# Patient Record
Sex: Female | Born: 1945
Health system: Southern US, Community
[De-identification: ages and names within clinical notes are randomized; demographics above are authoritative.]

## PROBLEM LIST (undated history)

## (undated) DIAGNOSIS — D649 Anemia, unspecified: Secondary | ICD-10-CM

## (undated) DIAGNOSIS — K7460 Unspecified cirrhosis of liver: Secondary | ICD-10-CM

## (undated) DIAGNOSIS — E785 Hyperlipidemia, unspecified: Secondary | ICD-10-CM

## (undated) DIAGNOSIS — R7303 Prediabetes: Secondary | ICD-10-CM

## (undated) DIAGNOSIS — I10 Essential (primary) hypertension: Secondary | ICD-10-CM

## (undated) DIAGNOSIS — L409 Psoriasis, unspecified: Secondary | ICD-10-CM

## (undated) DIAGNOSIS — C679 Malignant neoplasm of bladder, unspecified: Secondary | ICD-10-CM

## (undated) DIAGNOSIS — K219 Gastro-esophageal reflux disease without esophagitis: Secondary | ICD-10-CM

## (undated) HISTORY — PX: COLONOSCOPY: SHX174

## (undated) HISTORY — PX: ABDOMINAL HYSTERECTOMY: SHX81

## (undated) HISTORY — DX: Malignant neoplasm of bladder, unspecified: C67.9

---

## 2004-12-31 ENCOUNTER — Ambulatory Visit: Payer: Self-pay | Admitting: Unknown Physician Specialty

## 2005-03-15 ENCOUNTER — Ambulatory Visit: Payer: Self-pay | Admitting: Unknown Physician Specialty

## 2006-01-28 ENCOUNTER — Ambulatory Visit: Payer: Self-pay | Admitting: Unknown Physician Specialty

## 2007-02-10 ENCOUNTER — Ambulatory Visit: Payer: Self-pay | Admitting: Unknown Physician Specialty

## 2007-04-15 ENCOUNTER — Emergency Department: Payer: Self-pay | Admitting: Emergency Medicine

## 2007-12-12 ENCOUNTER — Ambulatory Visit: Payer: Self-pay | Admitting: Internal Medicine

## 2008-03-29 ENCOUNTER — Ambulatory Visit: Payer: Self-pay | Admitting: Unknown Physician Specialty

## 2008-06-30 ENCOUNTER — Ambulatory Visit: Payer: Self-pay | Admitting: Internal Medicine

## 2008-10-27 ENCOUNTER — Ambulatory Visit: Payer: Self-pay | Admitting: Unknown Physician Specialty

## 2009-03-30 ENCOUNTER — Ambulatory Visit: Payer: Self-pay | Admitting: Unknown Physician Specialty

## 2009-12-20 ENCOUNTER — Ambulatory Visit: Payer: Self-pay | Admitting: Unknown Physician Specialty

## 2010-04-03 ENCOUNTER — Ambulatory Visit: Payer: Self-pay | Admitting: Unknown Physician Specialty

## 2010-07-28 ENCOUNTER — Ambulatory Visit: Payer: Self-pay | Admitting: Internal Medicine

## 2011-09-04 ENCOUNTER — Ambulatory Visit: Payer: Self-pay | Admitting: Unknown Physician Specialty

## 2012-10-05 ENCOUNTER — Ambulatory Visit: Payer: Self-pay | Admitting: Unknown Physician Specialty

## 2012-12-18 ENCOUNTER — Ambulatory Visit: Payer: Self-pay | Admitting: Unknown Physician Specialty

## 2012-12-21 LAB — PATHOLOGY REPORT

## 2014-02-10 ENCOUNTER — Ambulatory Visit: Payer: Self-pay | Admitting: Physician Assistant

## 2014-05-13 ENCOUNTER — Ambulatory Visit: Payer: Self-pay | Admitting: Unknown Physician Specialty

## 2014-05-17 LAB — PATHOLOGY REPORT

## 2014-12-28 DIAGNOSIS — D510 Vitamin B12 deficiency anemia due to intrinsic factor deficiency: Secondary | ICD-10-CM | POA: Diagnosis not present

## 2014-12-28 DIAGNOSIS — E538 Deficiency of other specified B group vitamins: Secondary | ICD-10-CM | POA: Diagnosis not present

## 2015-02-09 DIAGNOSIS — D510 Vitamin B12 deficiency anemia due to intrinsic factor deficiency: Secondary | ICD-10-CM | POA: Diagnosis not present

## 2015-02-21 DIAGNOSIS — E780 Pure hypercholesterolemia: Secondary | ICD-10-CM | POA: Diagnosis not present

## 2015-02-21 DIAGNOSIS — D508 Other iron deficiency anemias: Secondary | ICD-10-CM | POA: Diagnosis not present

## 2015-02-21 DIAGNOSIS — Z9189 Other specified personal risk factors, not elsewhere classified: Secondary | ICD-10-CM | POA: Diagnosis not present

## 2015-02-21 DIAGNOSIS — D518 Other vitamin B12 deficiency anemias: Secondary | ICD-10-CM | POA: Diagnosis not present

## 2015-02-21 DIAGNOSIS — L409 Psoriasis, unspecified: Secondary | ICD-10-CM | POA: Diagnosis not present

## 2015-02-21 DIAGNOSIS — Z23 Encounter for immunization: Secondary | ICD-10-CM | POA: Diagnosis not present

## 2015-02-21 DIAGNOSIS — I10 Essential (primary) hypertension: Secondary | ICD-10-CM | POA: Diagnosis not present

## 2015-03-01 ENCOUNTER — Ambulatory Visit: Payer: Self-pay | Admitting: Physician Assistant

## 2015-03-01 DIAGNOSIS — R928 Other abnormal and inconclusive findings on diagnostic imaging of breast: Secondary | ICD-10-CM | POA: Diagnosis not present

## 2015-03-01 DIAGNOSIS — Z1231 Encounter for screening mammogram for malignant neoplasm of breast: Secondary | ICD-10-CM | POA: Diagnosis not present

## 2015-03-07 ENCOUNTER — Ambulatory Visit: Payer: Self-pay | Admitting: Physician Assistant

## 2015-03-07 DIAGNOSIS — N63 Unspecified lump in breast: Secondary | ICD-10-CM | POA: Diagnosis not present

## 2015-03-28 DIAGNOSIS — D510 Vitamin B12 deficiency anemia due to intrinsic factor deficiency: Secondary | ICD-10-CM | POA: Diagnosis not present

## 2015-05-03 DIAGNOSIS — D510 Vitamin B12 deficiency anemia due to intrinsic factor deficiency: Secondary | ICD-10-CM | POA: Diagnosis not present

## 2015-05-29 DIAGNOSIS — L820 Inflamed seborrheic keratosis: Secondary | ICD-10-CM | POA: Diagnosis not present

## 2015-05-29 DIAGNOSIS — L400 Psoriasis vulgaris: Secondary | ICD-10-CM | POA: Diagnosis not present

## 2015-06-06 DIAGNOSIS — E538 Deficiency of other specified B group vitamins: Secondary | ICD-10-CM | POA: Diagnosis not present

## 2015-07-27 DIAGNOSIS — E538 Deficiency of other specified B group vitamins: Secondary | ICD-10-CM | POA: Diagnosis not present

## 2015-08-24 ENCOUNTER — Other Ambulatory Visit: Payer: Self-pay | Admitting: Physician Assistant

## 2015-08-24 DIAGNOSIS — R7309 Other abnormal glucose: Secondary | ICD-10-CM | POA: Diagnosis not present

## 2015-08-24 DIAGNOSIS — L409 Psoriasis, unspecified: Secondary | ICD-10-CM | POA: Diagnosis not present

## 2015-08-24 DIAGNOSIS — E785 Hyperlipidemia, unspecified: Secondary | ICD-10-CM | POA: Diagnosis not present

## 2015-08-24 DIAGNOSIS — E780 Pure hypercholesterolemia: Secondary | ICD-10-CM | POA: Diagnosis not present

## 2015-08-24 DIAGNOSIS — N6002 Solitary cyst of left breast: Secondary | ICD-10-CM | POA: Diagnosis not present

## 2015-08-24 DIAGNOSIS — I10 Essential (primary) hypertension: Secondary | ICD-10-CM | POA: Diagnosis not present

## 2015-08-24 DIAGNOSIS — D518 Other vitamin B12 deficiency anemias: Secondary | ICD-10-CM | POA: Diagnosis not present

## 2015-08-29 DIAGNOSIS — E785 Hyperlipidemia, unspecified: Secondary | ICD-10-CM | POA: Diagnosis not present

## 2015-08-29 DIAGNOSIS — R7309 Other abnormal glucose: Secondary | ICD-10-CM | POA: Diagnosis not present

## 2015-08-29 DIAGNOSIS — E538 Deficiency of other specified B group vitamins: Secondary | ICD-10-CM | POA: Diagnosis not present

## 2015-08-29 DIAGNOSIS — E780 Pure hypercholesterolemia: Secondary | ICD-10-CM | POA: Diagnosis not present

## 2015-08-29 DIAGNOSIS — I10 Essential (primary) hypertension: Secondary | ICD-10-CM | POA: Diagnosis not present

## 2015-09-01 ENCOUNTER — Other Ambulatory Visit: Payer: Self-pay

## 2015-09-01 ENCOUNTER — Ambulatory Visit: Payer: Self-pay

## 2015-09-05 ENCOUNTER — Ambulatory Visit
Admission: RE | Admit: 2015-09-05 | Discharge: 2015-09-05 | Disposition: A | Discharge status: Home / Self Care | Payer: Commercial Managed Care - HMO | Source: Ambulatory Visit | Attending: Physician Assistant | Admitting: Physician Assistant

## 2015-09-05 ENCOUNTER — Other Ambulatory Visit: Payer: Self-pay | Admitting: Physician Assistant

## 2015-09-05 DIAGNOSIS — N63 Unspecified lump in breast: Secondary | ICD-10-CM | POA: Diagnosis not present

## 2015-09-05 DIAGNOSIS — N6002 Solitary cyst of left breast: Secondary | ICD-10-CM

## 2015-09-29 DIAGNOSIS — E538 Deficiency of other specified B group vitamins: Secondary | ICD-10-CM | POA: Diagnosis not present

## 2015-09-29 DIAGNOSIS — Z23 Encounter for immunization: Secondary | ICD-10-CM | POA: Diagnosis not present

## 2015-10-08 ENCOUNTER — Emergency Department: Payer: Commercial Managed Care - HMO

## 2015-10-08 ENCOUNTER — Encounter: Payer: Self-pay | Admitting: Emergency Medicine

## 2015-10-08 ENCOUNTER — Emergency Department
Admission: EM | Admit: 2015-10-08 | Discharge: 2015-10-08 | Disposition: A | Discharge status: Home / Self Care | Payer: Commercial Managed Care - HMO | Attending: Emergency Medicine | Admitting: Emergency Medicine

## 2015-10-08 DIAGNOSIS — R109 Unspecified abdominal pain: Secondary | ICD-10-CM | POA: Diagnosis not present

## 2015-10-08 DIAGNOSIS — K5901 Slow transit constipation: Secondary | ICD-10-CM | POA: Diagnosis not present

## 2015-10-08 DIAGNOSIS — R1030 Lower abdominal pain, unspecified: Secondary | ICD-10-CM | POA: Diagnosis not present

## 2015-10-08 DIAGNOSIS — I10 Essential (primary) hypertension: Secondary | ICD-10-CM | POA: Insufficient documentation

## 2015-10-08 HISTORY — DX: Hyperlipidemia, unspecified: E78.5

## 2015-10-08 HISTORY — DX: Unspecified cirrhosis of liver: K74.60

## 2015-10-08 HISTORY — DX: Essential (primary) hypertension: I10

## 2015-10-08 LAB — URINALYSIS COMPLETE WITH MICROSCOPIC (ARMC ONLY)
Bacteria, UA: NONE SEEN
Bilirubin Urine: NEGATIVE
Glucose, UA: NEGATIVE mg/dL
Hgb urine dipstick: NEGATIVE
Ketones, ur: NEGATIVE mg/dL
Leukocytes, UA: NEGATIVE
Nitrite: NEGATIVE
Protein, ur: NEGATIVE mg/dL
Specific Gravity, Urine: 1.015 (ref 1.005–1.030)
pH: 5 (ref 5.0–8.0)

## 2015-10-08 LAB — COMPREHENSIVE METABOLIC PANEL
ALT: 15 U/L (ref 14–54)
AST: 22 U/L (ref 15–41)
Albumin: 4.2 g/dL (ref 3.5–5.0)
Alkaline Phosphatase: 83 U/L (ref 38–126)
Anion gap: 6 (ref 5–15)
BUN: 14 mg/dL (ref 6–20)
CO2: 24 mmol/L (ref 22–32)
Calcium: 9.6 mg/dL (ref 8.9–10.3)
Chloride: 108 mmol/L (ref 101–111)
Creatinine, Ser: 0.9 mg/dL (ref 0.44–1.00)
GFR calc Af Amer: >60 mL/min (ref >60)
GFR calc non Af Amer: >60 mL/min (ref >60)
Glucose, Bld: 124 mg/dL — ABNORMAL HIGH (ref 65–99)
Potassium: 3.5 mmol/L (ref 3.5–5.1)
Sodium: 138 mmol/L (ref 135–145)
Total Bilirubin: 0.6 mg/dL (ref 0.3–1.2)
Total Protein: 8 g/dL (ref 6.5–8.1)

## 2015-10-08 LAB — CBC
HCT: 38.9 % (ref 35.0–47.0)
Hemoglobin: 13.1 g/dL (ref 12.0–16.0)
MCH: 27.8 pg (ref 26.0–34.0)
MCHC: 33.6 g/dL (ref 32.0–36.0)
MCV: 82.7 fL (ref 80.0–100.0)
Platelets: 242 10*3/uL (ref 150–440)
RBC: 4.7 MIL/uL (ref 3.80–5.20)
RDW: 13.7 % (ref 11.5–14.5)
WBC: 8.7 10*3/uL (ref 3.6–11.0)

## 2015-10-08 LAB — LIPASE, BLOOD: Lipase: 28 U/L (ref 22–51)

## 2015-10-08 MED ORDER — POLYETHYLENE GLYCOL 3350 17 G PO PACK: 17.0000 g | PACK | Freq: Every day | ORAL | Status: DC

## 2015-10-08 MED ORDER — ONDANSETRON HCL 4 MG PO TABS: 4.0000 mg | ORAL_TABLET | Freq: Every day | ORAL | Status: DC | PRN

## 2015-10-08 NOTE — ED Provider Notes (Signed)
[facility] Emergency Department Provider Note     Time seen: ----------------------------------------- 7:22 PM on 10/08/2015 -----------------------------------------    I have reviewed the triage vital signs and the nursing notes.   HISTORY  Chief Complaint Abdominal Pain    HPI Marie Bates is a 69 y.o. female who presents ER with lower abdominal pain for the past 2 hours. Patient denies any fevers, but has had chills. She describes lower abdominal pressure, nothing makes it better or worse. She does not have any dysuria, nausea, vomiting or diarrhea. Denies history of same.   Past Medical History  Diagnosis Date  . Hypertension   . Hyperlipemia   . Cirrhosis ([place])     There are no active problems to display for this patient.   Past Surgical History  Procedure Laterality Date  . Abdominal hysterectomy      Allergies Review of patient's allergies indicates no known allergies.  Social History Social History  Substance Use Topics  . Smoking status: Never Smoker   . Smokeless tobacco: Never Used  . Alcohol Use: No    Review of Systems Constitutional: Negative for fever. As to for chills Eyes: Negative for visual changes. ENT: Negative for sore throat. Cardiovascular: Negative for chest pain. Respiratory: Negative for shortness of breath. Gastrointestinal: Positive for abdominal pain, negative for vomiting or diarrhea. Genitourinary: Negative for dysuria. Musculoskeletal: Negative for back pain. Skin: Negative for rash. Neurological: Negative for headaches, focal weakness or numbness.  10-point ROS otherwise negative.  ____________________________________________   PHYSICAL EXAM:  VITAL SIGNS: ED Triage Vitals  Enc Vitals Group     BP 10/08/15 1855 149/74 mmHg     Pulse Rate 10/08/15 1855 57     Resp 10/08/15 1855 18     Temp 10/08/15 1855 98.2 F (36.8 C)     Temp Source 10/08/15 1855 Oral     SpO2 10/08/15 1855 97  %     Weight 10/08/15 1855 225 lb (102.059 kg)     Height 10/08/15 1855 5\' 8"  (1.727 m)     Head Cir --      Peak Flow --      Pain Score 10/08/15 1859 10     Pain Loc --      Pain Edu? --      Excl. in [place]? --     Constitutional: Alert and oriented. Well appearing and in no distress. Eyes: Conjunctivae are normal. PERRL. Normal extraocular movements. ENT   Head: Normocephalic and atraumatic.   Nose: No congestion/rhinnorhea.   Mouth/Throat: Mucous membranes are moist.   Neck: No stridor. Cardiovascular: Normal rate, regular rhythm. Normal and symmetric distal pulses are present in all extremities. No murmurs, rubs, or gallops. Respiratory: Normal respiratory effort without tachypnea nor retractions. Breath sounds are clear and equal bilaterally. No wheezes/rales/rhonchi. Gastrointestinal: Soft and non-focally tender. No rebound or guarding. Hypoactive bowel sounds Musculoskeletal: Nontender with normal range of motion in all extremities. No joint effusions.  No lower extremity tenderness nor edema. Neurologic:  Normal speech and language. No gross focal neurologic deficits are appreciated. Speech is normal. No gait instability. Skin:  Skin is warm, dry and intact. No rash noted. Psychiatric: Mood and affect are normal. Speech and behavior are normal. Patient exhibits appropriate insight and judgment. ____________________________________________  ED COURSE:  Pertinent labs & imaging results that were available during my care of the patient were reviewed by me and considered in my medical decision making (see chart for details). Patient is in  no acute distress, will check abdominal labs and check basic x-rays. ____________________________________________    LABS (pertinent positives/negatives)  Labs Reviewed  COMPREHENSIVE METABOLIC PANEL - Abnormal; Notable for the following:    Glucose, Bld 124 (*)    All other components within normal limits  URINALYSIS  COMPLETEWITH MICROSCOPIC ([facility] ONLY) - Abnormal; Notable for the following:    Color, Urine YELLOW (*)    APPearance CLEAR (*)    Squamous Epithelial / LPF 0-5 (*)    All other components within normal limits  LIPASE, BLOOD  CBC    RADIOLOGY Images were viewed by me  Abdomen 2 view IMPRESSION: No acute abnormality.  Large colonic stool burden. ____________________________________________  FINAL ASSESSMENT AND PLAN  Abdominal pain, constipation  Plan: Patient with labs and imaging as dictated above. Patient will be discharged with MiraLAX, encouraged to have close follow-up with her doctor in 2 days for recheck.   Earleen Newport, MD   Earleen Newport, MD 10/08/15 2126

## 2015-10-08 NOTE — ED Notes (Addendum)
Pt reports lower abd pain for past 2 hours. Denies nausea, vomiting or diarrhea. Denies fever. Denies urinary complaints.

## 2015-10-08 NOTE — Discharge Instructions (Signed)
Constipation, Adult °Constipation is when a person has fewer than three bowel movements a week, has difficulty having a bowel movement, or has stools that are dry, hard, or larger than normal. As people grow older, constipation is more common. A low-fiber diet, not taking in enough fluids, and taking certain medicines may make constipation worse.  °CAUSES  °· Certain medicines, such as antidepressants, pain medicine, iron supplements, antacids, and water pills.   °· Certain diseases, such as diabetes, irritable bowel syndrome (IBS), thyroid disease, or depression.   °· Not drinking enough water.   °· Not eating enough fiber-rich foods.   °· Stress or travel.   °· Lack of physical activity or exercise.   °· Ignoring the urge to have a bowel movement.   °· Using laxatives too much.   °SIGNS AND SYMPTOMS  °· Having fewer than three bowel movements a week.   °· Straining to have a bowel movement.   °· Having stools that are hard, dry, or larger than normal.   °· Feeling full or bloated.   °· Pain in the lower abdomen.   °· Not feeling relief after having a bowel movement.   °DIAGNOSIS  °Your health care provider will take a medical history and perform a physical exam. Further testing may be done for severe constipation. Some tests may include: °· A barium enema X-ray to examine your rectum, colon, and, sometimes, your small intestine.   °· A sigmoidoscopy to examine your lower colon.   °· A colonoscopy to examine your entire colon. °TREATMENT  °Treatment will depend on the severity of your constipation and what is causing it. Some dietary treatments include drinking more fluids and eating more fiber-rich foods. Lifestyle treatments may include regular exercise. If these diet and lifestyle recommendations do not help, your health care provider may recommend taking over-the-counter laxative medicines to help you have bowel movements. Prescription medicines may be prescribed if over-the-counter medicines do not work.    °HOME CARE INSTRUCTIONS  °· Eat foods that have a lot of fiber, such as fruits, vegetables, whole grains, and beans. °· Limit foods high in fat and processed sugars, such as french fries, hamburgers, cookies, candies, and soda.   °· A fiber supplement may be added to your diet if you cannot get enough fiber from foods.   °· Drink enough fluids to keep your urine clear or pale yellow.   °· Exercise regularly or as directed by your health care provider.   °· Go to the restroom when you have the urge to go. Do not hold it.   °· Only take over-the-counter or prescription medicines as directed by your health care provider. Do not take other medicines for constipation without talking to your health care provider first.   °SEEK IMMEDIATE MEDICAL CARE IF:  °· You have bright red blood in your stool.   °· Your constipation lasts for more than 4 days or gets worse.   °· You have abdominal or rectal pain.   °· You have thin, pencil-like stools.   °· You have unexplained weight loss. °MAKE SURE YOU:  °· Understand these instructions. °· Will watch your condition. °· Will get help right away if you are not doing well or get worse. °  °This information is not intended to replace advice given to you by your health care provider. Make sure you discuss any questions you have with your health care provider. °  °Document Released: 09/06/2004 Document Revised: 12/30/2014 Document Reviewed: 09/20/2013 °Elsevier Interactive Patient Education ©2016 Elsevier Inc. ° °Abdominal Pain, Adult °Many things can cause abdominal pain. Usually, abdominal pain is not caused by a disease and   will improve without treatment. It can often be observed and treated at home. Your health care provider will do a physical exam and possibly order blood tests and X-rays to help determine the seriousness of your pain. However, in many cases, more time must pass before a clear cause of the pain can be found. Before that point, your health care provider may not  know if you need more testing or further treatment. °HOME CARE INSTRUCTIONS °Monitor your abdominal pain for any changes. The following actions may help to alleviate any discomfort you are experiencing: °· Only take over-the-counter or prescription medicines as directed by your health care provider. °· Do not take laxatives unless directed to do so by your health care provider. °· Try a clear liquid diet (broth, tea, or water) as directed by your health care provider. Slowly move to a bland diet as tolerated. °SEEK MEDICAL CARE IF: °· You have unexplained abdominal pain. °· You have abdominal pain associated with nausea or diarrhea. °· You have pain when you urinate or have a bowel movement. °· You experience abdominal pain that wakes you in the night. °· You have abdominal pain that is worsened or improved by eating food. °· You have abdominal pain that is worsened with eating fatty foods. °· You have a fever. °SEEK IMMEDIATE MEDICAL CARE IF: °· Your pain does not go away within 2 hours. °· You keep throwing up (vomiting). °· Your pain is felt only in portions of the abdomen, such as the right side or the left lower portion of the abdomen. °· You pass bloody or black tarry stools. °MAKE SURE YOU: °· Understand these instructions. °· Will watch your condition. °· Will get help right away if you are not doing well or get worse. °  °This information is not intended to replace advice given to you by your health care provider. Make sure you discuss any questions you have with your health care provider. °  °Document Released: 09/18/2005 Document Revised: 08/30/2015 Document Reviewed: 08/18/2013 °Elsevier Interactive Patient Education ©2016 Elsevier Inc. ° °

## 2015-10-30 DIAGNOSIS — L409 Psoriasis, unspecified: Secondary | ICD-10-CM | POA: Diagnosis not present

## 2015-10-30 DIAGNOSIS — E538 Deficiency of other specified B group vitamins: Secondary | ICD-10-CM | POA: Diagnosis not present

## 2015-11-27 DIAGNOSIS — L400 Psoriasis vulgaris: Secondary | ICD-10-CM | POA: Diagnosis not present

## 2015-12-28 DIAGNOSIS — E538 Deficiency of other specified B group vitamins: Secondary | ICD-10-CM | POA: Diagnosis not present

## 2016-01-30 ENCOUNTER — Other Ambulatory Visit: Payer: Self-pay | Admitting: Physician Assistant

## 2016-01-30 DIAGNOSIS — E538 Deficiency of other specified B group vitamins: Secondary | ICD-10-CM | POA: Diagnosis not present

## 2016-01-30 DIAGNOSIS — N6320 Unspecified lump in the left breast, unspecified quadrant: Secondary | ICD-10-CM

## 2016-02-21 DIAGNOSIS — D509 Iron deficiency anemia, unspecified: Secondary | ICD-10-CM | POA: Diagnosis not present

## 2016-02-21 DIAGNOSIS — E7800 Pure hypercholesterolemia, unspecified: Secondary | ICD-10-CM | POA: Diagnosis not present

## 2016-02-21 DIAGNOSIS — R7303 Prediabetes: Secondary | ICD-10-CM | POA: Diagnosis not present

## 2016-02-21 DIAGNOSIS — M8580 Other specified disorders of bone density and structure, unspecified site: Secondary | ICD-10-CM | POA: Diagnosis not present

## 2016-02-21 DIAGNOSIS — R7309 Other abnormal glucose: Secondary | ICD-10-CM | POA: Diagnosis not present

## 2016-02-21 DIAGNOSIS — I10 Essential (primary) hypertension: Secondary | ICD-10-CM | POA: Diagnosis not present

## 2016-02-21 DIAGNOSIS — Z23 Encounter for immunization: Secondary | ICD-10-CM | POA: Diagnosis not present

## 2016-02-21 DIAGNOSIS — Z0000 Encounter for general adult medical examination without abnormal findings: Secondary | ICD-10-CM | POA: Diagnosis not present

## 2016-02-21 DIAGNOSIS — L409 Psoriasis, unspecified: Secondary | ICD-10-CM | POA: Diagnosis not present

## 2016-02-21 DIAGNOSIS — D519 Vitamin B12 deficiency anemia, unspecified: Secondary | ICD-10-CM | POA: Diagnosis not present

## 2016-03-01 ENCOUNTER — Ambulatory Visit
Admission: RE | Admit: 2016-03-01 | Discharge: 2016-03-01 | Disposition: A | Discharge status: Home / Self Care | Payer: Commercial Managed Care - HMO | Source: Ambulatory Visit | Attending: Physician Assistant | Admitting: Physician Assistant

## 2016-03-01 ENCOUNTER — Other Ambulatory Visit: Payer: Self-pay | Admitting: Physician Assistant

## 2016-03-01 DIAGNOSIS — N63 Unspecified lump in breast: Secondary | ICD-10-CM | POA: Diagnosis not present

## 2016-03-01 DIAGNOSIS — E538 Deficiency of other specified B group vitamins: Secondary | ICD-10-CM | POA: Diagnosis not present

## 2016-03-01 DIAGNOSIS — R928 Other abnormal and inconclusive findings on diagnostic imaging of breast: Secondary | ICD-10-CM | POA: Diagnosis not present

## 2016-03-01 DIAGNOSIS — N6320 Unspecified lump in the left breast, unspecified quadrant: Secondary | ICD-10-CM

## 2016-03-04 DIAGNOSIS — L400 Psoriasis vulgaris: Secondary | ICD-10-CM | POA: Diagnosis not present

## 2016-03-27 DIAGNOSIS — Z0100 Encounter for examination of eyes and vision without abnormal findings: Secondary | ICD-10-CM | POA: Diagnosis not present

## 2016-03-27 DIAGNOSIS — H524 Presbyopia: Secondary | ICD-10-CM | POA: Diagnosis not present

## 2016-03-27 DIAGNOSIS — H5210 Myopia, unspecified eye: Secondary | ICD-10-CM | POA: Diagnosis not present

## 2016-04-24 DIAGNOSIS — E538 Deficiency of other specified B group vitamins: Secondary | ICD-10-CM | POA: Diagnosis not present

## 2016-05-27 DIAGNOSIS — E538 Deficiency of other specified B group vitamins: Secondary | ICD-10-CM | POA: Diagnosis not present

## 2016-08-05 DIAGNOSIS — L400 Psoriasis vulgaris: Secondary | ICD-10-CM | POA: Diagnosis not present

## 2016-08-23 DIAGNOSIS — D509 Iron deficiency anemia, unspecified: Secondary | ICD-10-CM | POA: Diagnosis not present

## 2016-08-23 DIAGNOSIS — R7303 Prediabetes: Secondary | ICD-10-CM | POA: Diagnosis not present

## 2016-08-23 DIAGNOSIS — I10 Essential (primary) hypertension: Secondary | ICD-10-CM | POA: Diagnosis not present

## 2016-08-23 DIAGNOSIS — D519 Vitamin B12 deficiency anemia, unspecified: Secondary | ICD-10-CM | POA: Diagnosis not present

## 2016-08-23 DIAGNOSIS — E7800 Pure hypercholesterolemia, unspecified: Secondary | ICD-10-CM | POA: Diagnosis not present

## 2016-08-23 DIAGNOSIS — L409 Psoriasis, unspecified: Secondary | ICD-10-CM | POA: Diagnosis not present

## 2016-09-24 DIAGNOSIS — E538 Deficiency of other specified B group vitamins: Secondary | ICD-10-CM | POA: Diagnosis not present

## 2016-09-24 DIAGNOSIS — Z23 Encounter for immunization: Secondary | ICD-10-CM | POA: Diagnosis not present

## 2016-09-24 DIAGNOSIS — D519 Vitamin B12 deficiency anemia, unspecified: Secondary | ICD-10-CM | POA: Diagnosis not present

## 2016-10-25 DIAGNOSIS — D519 Vitamin B12 deficiency anemia, unspecified: Secondary | ICD-10-CM | POA: Diagnosis not present

## 2016-10-25 DIAGNOSIS — E538 Deficiency of other specified B group vitamins: Secondary | ICD-10-CM | POA: Diagnosis not present

## 2017-02-20 ENCOUNTER — Other Ambulatory Visit: Payer: Self-pay | Admitting: Physician Assistant

## 2017-02-20 DIAGNOSIS — Z1231 Encounter for screening mammogram for malignant neoplasm of breast: Secondary | ICD-10-CM

## 2017-03-19 ENCOUNTER — Ambulatory Visit: Payer: Commercial Managed Care - HMO

## 2017-04-11 ENCOUNTER — Ambulatory Visit: Payer: Commercial Managed Care - HMO

## 2017-05-01 ENCOUNTER — Ambulatory Visit
Admission: RE | Admit: 2017-05-01 | Discharge: 2017-05-01 | Disposition: A | Discharge status: Home / Self Care | Payer: Commercial Managed Care - HMO | Source: Ambulatory Visit | Attending: Physician Assistant | Admitting: Physician Assistant

## 2017-05-01 DIAGNOSIS — Z1231 Encounter for screening mammogram for malignant neoplasm of breast: Secondary | ICD-10-CM | POA: Insufficient documentation

## 2018-04-22 ENCOUNTER — Other Ambulatory Visit: Payer: Self-pay | Admitting: Physician Assistant

## 2018-04-22 DIAGNOSIS — Z1231 Encounter for screening mammogram for malignant neoplasm of breast: Secondary | ICD-10-CM

## 2018-05-08 ENCOUNTER — Ambulatory Visit
Admission: RE | Admit: 2018-05-08 | Discharge: 2018-05-08 | Disposition: A | Discharge status: Home / Self Care | Payer: Medicare PPO | Source: Ambulatory Visit | Attending: Physician Assistant | Admitting: Physician Assistant

## 2018-05-08 DIAGNOSIS — Z1231 Encounter for screening mammogram for malignant neoplasm of breast: Secondary | ICD-10-CM | POA: Diagnosis not present

## 2018-11-03 ENCOUNTER — Other Ambulatory Visit: Payer: Self-pay | Admitting: Physician Assistant

## 2018-11-03 DIAGNOSIS — R310 Gross hematuria: Secondary | ICD-10-CM

## 2018-11-18 ENCOUNTER — Ambulatory Visit: Payer: Medicare PPO

## 2018-11-24 NOTE — Progress Notes (Signed)
11/26/2018  2:46 PM   Morrie Sheldon 1946/08/27 [id number]  Referring provider: Marinda Elk, MD [facility] [place] [address]  Chief Complaint  Patient presents with  . Hematuria    HPI: Marie Bates is a 72 yo F who presents today for the evaluation and management of gross hematuria. She was referred to [place] by Marinda Elk, MD.   She reports of gross hematuria with an onset in mid-November and has been consistent ever since while she urinates. She denies pain or discomfort while urinating. She has no abdominal or flank pain. She also reports of no prior hx with urologist. She is not a smoker.  Urinalysis at [facility] was grossly clear but had >50 RBC   PMH: Past Medical History:  Diagnosis Date  . Cirrhosis ([place])   . Hyperlipemia   . Hypertension     Surgical History: Past Surgical History:  Procedure Laterality Date  . ABDOMINAL HYSTERECTOMY      Home Medications:  Allergies as of 11/26/2018   No Known Allergies     Medication List        Accurate as of 11/26/18  2:46 PM. Always use your most recent med list.          amLODipine 10 MG tablet Commonly known as:  NORVASC TAKE 1 TABLET BY MOUTH ONCE DAILY   Apremilast 30 MG Tabs Take by mouth.   aspirin EC 81 MG tablet Take by mouth.   atorvastatin 40 MG tablet Commonly known as:  LIPITOR TAKE 1 TABLET BY MOUTH ONCE DAILY   clotrimazole-betamethasone cream Commonly known as:  LOTRISONE Apply topically.   cyanocobalamin 1000 MCG/ML injection Commonly known as:  (VITAMIN B-12) Inject into the muscle.   ferrous sulfate 325 (65 FE) MG tablet Take by mouth.   hydrochlorothiazide 25 MG tablet Commonly known as:  HYDRODIURIL take 1 tablet by mouth once daily   meloxicam 15 MG tablet Commonly known as:  MOBIC Take 15 mg by mouth daily.   omeprazole 20 MG capsule Commonly known as:  PRILOSEC TAKE 1 CAPSULE BY MOUTH ONCE DAILY     ondansetron 4 MG tablet Commonly known as:  ZOFRAN Take 1 tablet (4 mg total) by mouth daily as needed for nausea or vomiting.   polyethylene glycol packet Commonly known as:  MIRALAX / GLYCOLAX Take 17 g by mouth daily.   traZODone 50 MG tablet Commonly known as:  DESYREL Take by mouth.   Vitamin D3 50 MCG (2000 UT) capsule Take by mouth.       Allergies: No Known Allergies  Family History: Family History  Problem Relation Age of Onset  . Breast cancer Neg Hx     Social History:  reports that she has never smoked. She has never used smokeless tobacco. She reports that she does not drink alcohol or use drugs.  ROS: UROLOGY Frequent Urination?: No Hard to postpone urination?: No Burning/pain with urination?: No Get up at night to urinate?: Yes Leakage of urine?: No Urine stream starts and stops?: No Trouble starting stream?: No Do you have to strain to urinate?: No Blood in urine?: Yes Urinary tract infection?: No Sexually transmitted disease?: No Injury to kidneys or bladder?: No Painful intercourse?: No Weak stream?: No Currently pregnant?: No Vaginal bleeding?: No Last menstrual period?: Hysterectomy  Gastrointestinal Nausea?: No Vomiting?: No Indigestion/heartburn?: No Diarrhea?: No Constipation?: Yes  Constitutional Fever: No Night sweats?: No Weight loss?: No Fatigue?: No  Skin Skin rash/lesions?: Yes Itching?: No  Eyes Blurred vision?: No Double vision?: No  Ears/Nose/Throat Sore throat?: No Sinus problems?: No  Hematologic/Lymphatic Swollen glands?: No Easy bruising?: No  Cardiovascular Leg swelling?: No Chest pain?: No  Respiratory Cough?: No Shortness of breath?: No  Endocrine Excessive thirst?: No  Musculoskeletal Back pain?: No Joint pain?: Yes  Neurological Headaches?: No Dizziness?: No  Psychologic Depression?: No Anxiety?: No  Physical Exam: BP (!) 162/91 (BP Location: Left Arm, Patient Position:  Sitting, Cuff Size: Large)   Pulse 80   Ht 5\' 8"  (1.727 m)   Wt 216 lb 11.2 oz (98.3 kg)   BMI 32.95 kg/m   Constitutional: Well nourished. Alert and oriented, No acute distress. HEENT: [place] AT, moist mucus membranes. Trachea midline, no masses. Cardiovascular: No clubbing, cyanosis, or edema. Respiratory: Normal respiratory effort, no increased work of breathing. GI: Abdomen is soft, non tender, non distended, no abdominal masses. Liver and spleen not palpable.  No hernias appreciated.  Stool sample for occult testing is not indicated.   GU: No CVA tenderness.  No bladder fullness or masses. Constitutional:  Well nourished. Alert and oriented, No acute distress. Skin: No rashes, bruises or suspicious lesions. Neurologic: Grossly intact, no focal deficits, moving all 4 extremities. Psychiatric: Normal mood and affect.   Laboratory Data:  Urinalysis Her UA shows >30 RBC, 0 WBC  Assessment & Plan:    1. Gross Hematuria -We discussed common possible etiologies of hematuria including BPH, malignancy, urolithiasis, medical renal disease, and idiopathic. Standard workup recommended by the AUA includes imaging with CT urogram to assess the upper tracts, and cystoscopy. Cytology is performed on patient's with gross hematuria to look for malignant cells in the urine.   Abbie Sons, [place] [address] [place], [place] [address] [phone]   I, Lucas Mallow, am acting as a scribe for Dr. Nicki Reaper C. Brnadon Eoff  I, Abbie Sons, MD, have reviewed all documentation for this visit. The documentation on 11/26/18 for the exam, diagnosis, procedures, and orders are all accurate and complete.

## 2018-11-26 ENCOUNTER — Encounter: Payer: Self-pay | Admitting: Urology

## 2018-11-26 ENCOUNTER — Ambulatory Visit: Payer: Medicare PPO | Admitting: Urology

## 2018-11-26 VITALS — BP 162/91 | HR 80 | Ht 68.0 in | Wt 216.7 lb

## 2018-11-26 DIAGNOSIS — R310 Gross hematuria: Secondary | ICD-10-CM | POA: Diagnosis not present

## 2018-11-26 DIAGNOSIS — E7800 Pure hypercholesterolemia, unspecified: Secondary | ICD-10-CM | POA: Insufficient documentation

## 2018-11-26 DIAGNOSIS — L409 Psoriasis, unspecified: Secondary | ICD-10-CM | POA: Insufficient documentation

## 2018-11-26 DIAGNOSIS — I10 Essential (primary) hypertension: Secondary | ICD-10-CM | POA: Insufficient documentation

## 2018-11-26 DIAGNOSIS — M8580 Other specified disorders of bone density and structure, unspecified site: Secondary | ICD-10-CM | POA: Insufficient documentation

## 2018-11-26 DIAGNOSIS — D509 Iron deficiency anemia, unspecified: Secondary | ICD-10-CM | POA: Insufficient documentation

## 2018-11-26 LAB — URINALYSIS, COMPLETE
Bilirubin, UA: NEGATIVE
Glucose, UA: NEGATIVE
Ketones, UA: NEGATIVE
Leukocytes, UA: NEGATIVE
Nitrite, UA: NEGATIVE
Specific Gravity, UA: 1.02 (ref 1.005–1.030)
Urobilinogen, Ur: 2 mg/dL — ABNORMAL HIGH (ref 0.2–1.0)
pH, UA: 5.5 (ref 5.0–7.5)

## 2018-11-26 LAB — MICROSCOPIC EXAMINATION
RBC, UA: >30 /hpf — ABNORMAL HIGH (ref 0–2)
WBC, UA: NONE SEEN /hpf (ref 0–5)

## 2018-12-07 ENCOUNTER — Ambulatory Visit
Admission: RE | Admit: 2018-12-07 | Discharge: 2018-12-07 | Disposition: A | Discharge status: Home / Self Care | Payer: Medicare PPO | Source: Ambulatory Visit | Attending: Urology | Admitting: Urology

## 2018-12-07 DIAGNOSIS — R310 Gross hematuria: Secondary | ICD-10-CM | POA: Insufficient documentation

## 2018-12-07 MED ORDER — IOPAMIDOL (ISOVUE-300) INJECTION 61%
125.0000 mL | Freq: Once | INTRAVENOUS | Status: AC | PRN
  Administered 2018-12-07: 125 mL via INTRAVENOUS

## 2018-12-10 ENCOUNTER — Telehealth: Payer: Self-pay | Admitting: Radiology

## 2018-12-10 ENCOUNTER — Ambulatory Visit: Payer: Medicare PPO | Admitting: Urology

## 2018-12-10 ENCOUNTER — Other Ambulatory Visit: Payer: Self-pay | Admitting: Radiology

## 2018-12-10 ENCOUNTER — Encounter: Payer: Self-pay | Admitting: Urology

## 2018-12-10 VITALS — BP 176/75 | HR 67 | Ht 68.0 in | Wt 216.0 lb

## 2018-12-10 DIAGNOSIS — R310 Gross hematuria: Secondary | ICD-10-CM | POA: Diagnosis not present

## 2018-12-10 DIAGNOSIS — D494 Neoplasm of unspecified behavior of bladder: Secondary | ICD-10-CM | POA: Insufficient documentation

## 2018-12-10 LAB — URINALYSIS, COMPLETE
Bilirubin, UA: NEGATIVE
Glucose, UA: NEGATIVE
Ketones, UA: NEGATIVE
Leukocytes, UA: NEGATIVE
Nitrite, UA: NEGATIVE
Protein, UA: NEGATIVE
RBC, UA: NEGATIVE
Specific Gravity, UA: 1.02 (ref 1.005–1.030)
Urobilinogen, Ur: 4 mg/dL — ABNORMAL HIGH (ref 0.2–1.0)
pH, UA: 6 (ref 5.0–7.5)

## 2018-12-10 LAB — MICROSCOPIC EXAMINATION: Epithelial Cells (non renal): NONE SEEN /hpf (ref 0–10)

## 2018-12-10 MED ORDER — LIDOCAINE HCL URETHRAL/MUCOSAL 2 % EX GEL
1.0000 "application " | Freq: Once | CUTANEOUS | Status: AC
  Administered 2018-12-10: 1 via URETHRAL

## 2018-12-10 MED ORDER — SODIUM CHLORIDE 0.9 % IR SOLN: 2000.0000 mg | Freq: Once | Status: DC

## 2018-12-10 NOTE — Progress Notes (Signed)
12/10/18  CC:  Chief Complaint  Patient presents with  . Cysto    HPI: Initially seen 11/26/2018 for total gross painless hematuria.  She denies recurrent bleeding.  Blood pressure (!) 176/75, pulse 67, height 5\' 8"  (1.727 m), weight 216 lb (98 kg). NED. A&Ox3.   Atrophic genitalia with patent urethral meatus  Cystoscopy Procedure Note  Patient identification was confirmed, informed consent was obtained, and patient was prepped using Betadine solution.  Lidocaine jelly was administered per urethral meatus.    Procedure: - Flexible cystoscope introduced, without any difficulty.   - Thorough search of the bladder revealed:    normal urethral meatus    On the left posterior wall is an approximately 2 cm papillary lesion which appears superficial.  No other mucosal abnormalities are noted.    no stones    no ulcers     no trabeculation  - Ureteral orifices were normal in position and appearance.  Post-Procedure: - Patient tolerated the procedure well  Imaging:  CLINICAL DATA:  72 year old female with history of gross hematuria in November 2019. No associated abdominal or pelvic pain.  EXAM: CT ABDOMEN AND PELVIS WITHOUT AND WITH CONTRAST  TECHNIQUE: Multidetector CT imaging of the abdomen and pelvis was performed following the standard protocol before and following the bolus administration of intravenous contrast.  CONTRAST:  170mL ISOVUE-300 IOPAMIDOL (ISOVUE-300) INJECTION 61%  COMPARISON:  None.  FINDINGS: Lower chest: Aortic atherosclerosis.  Hepatobiliary: No suspicious cystic or solid hepatic lesions. No intra or extrahepatic biliary ductal dilatation. Gallbladder is normal in appearance.  Pancreas: No pancreatic mass. No pancreatic ductal dilatation. No pancreatic or peripancreatic fluid or inflammatory changes.  Spleen: Unremarkable.  Adrenals/Urinary Tract: 2 mm nonobstructive calculus in the upper pole collecting system of the left  kidney. No additional calculi are noted within the collecting system of the right kidney, along the course of either ureter, or within the lumen of the urinary bladder. Numerous subcentimeter low-attenuation lesions are noted in both kidneys, too small to characterize. In the upper pole of the right kidney there is a 2 cm low-intermediate attenuation lesion with equivocal enhancement (20 HU on precontrast images and 30 HU on post-contrast images) and potential internal septations, considered indeterminate. Two other smaller similar appearing indeterminate lesions are also noted in the upper pole of the right kidney. Multifocal cortical thinning in the right kidney. In the anterior aspect of the urinary bladder best appreciated on axial image 78 of series 9 and coronal image 67 of series 12 there is a 1.6 x 2.4 x 2.3 cm lesion which demonstrates enhancement and corresponds to a filling defect on delayed post-contrast images, highly concerning for primary urothelial neoplasm. Bilateral adrenal glands are normal in appearance.  Stomach/Bowel: Normal appearance of the stomach. No pathologic dilatation of small bowel or colon. Appendix is not confidently identified likely surgically absent.  Vascular/Lymphatic: Aortic atherosclerosis, without evidence of aneurysm or dissection in the abdominal or pelvic vasculature.  Reproductive: Status post hysterectomy. Ovaries are not confidently identified may be surgically absent.  Other: In the lower right anterior hemipelvis (axial image 71 of series 9 and coronal image 51 of series 12) there is a mixed fatty attenuation and soft tissue attenuation lesion measuring 5.4 x 6.3 x 5.6 cm which demonstrates some low-level internal enhancement. This is smoothly marginated.  Musculoskeletal: There are no aggressive appearing lytic or blastic lesions noted in the visualized portions of the skeleton.  IMPRESSION: 1. 1.6 x 2.4 x 2.3 cm lesion in  the anterior aspect of the urinary bladder, highly concerning for primary urothelial neoplasm. Correlation with cystoscopy is suggested if clinically appropriate. 2. In the anterior aspect of the low right hemipelvis there is an indeterminate 5.4 x 6.3 x 5.6 cm mass which demonstrates internal fatty attenuation and soft tissue attenuation with some internal enhancement. If the patient has had prior ovariopexy, the possibility of an ovarian dermoid warrants consideration. The other primary differential consideration is that of a liposarcoma. Further evaluation with PET-CT could be considered. At the very least, close attention on future follow-up studies is recommended to ensure stability of this finding. 3. Several indeterminate lesions are noted in the upper pole of the right kidney. Further evaluation with nonemergent MRI of the abdomen with and without IV gadolinium is recommended in the near future to provide definitive characterization. 4. 2 mm nonobstructive calculus in the upper pole collecting system of the left kidney. No ureteral stones or findings of urinary tract obstruction are noted at this time. 5. Aortic atherosclerosis.   Electronically Signed   By: Vinnie Langton M.D.   On: 12/07/2018 15:35   Assessment/ Plan: 1.  Papillary bladder tumor which is the most likely source of her hematuria.  This is endoscopically consistent with a superficial urothelial carcinoma.  The findings were discussed in detail with Ms. Manner.  I recommended scheduling TURBT. The indications and nature of the planned procedure were discussed as well as the potential benefits and expected outcome.  Alternatives have been discussed.  Potential complications were discussed including but not limited to bleeding, infection and bladder perforation. The postoperative care and followup was reviewed.  The patient was informed that she may need additional treatment along with periodic surveillance  cystoscopy.  All of her questions were answered and she desires to proceed.  Will plan on post resection intravesical gemcitabine.  2.  Indeterminate renal lesions. 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  Will schedule a MRI with and without contrast in 3-4 months  3.  Pelvic mass Will refer to oncology for further evaluation  Abbie Sons, MD

## 2018-12-10 NOTE — Telephone Encounter (Signed)
Patient was given the [facility] Information form below as well as the Instructions for Pre-Admission Testing form & a map of [facility]   [address], [place], [place] [address] Telephone: [phone] Fax: [phone]   Thank you for choosing [facility] for your upcoming surgery!  We are always here to assist in your urological needs.  Please read the following information with specific details for your upcoming appointments related to your surgery. Please contact Jalin Alicea at [phone] Option 3 with any questions.  The Name of Your Surgery: Transurethral resection of bladder tumor with intravesical gemcitabine instillation Your Surgery Date: 01/05/2019 Your Surgeon: John Giovanni  Please call Same Day Surgery at [phone] between the hours of 1pm-3pm one day prior to your surgery. They will inform you of the time to arrive at Same Day Surgery which is located on the second floor of the [facility].   Please refer to the attached letter regarding instructions for Pre-Admission Testing. You will receive a call from the [place] office regarding your appointment with them.  The Pre-Admission Testing office is located at [place], on the first floor of the [facility] [place] (office is to the right as you enter through the [employer] of the [facility]). Please have all medications you are currently taking and your insurance card available.   Patient was advised to have nothing to eat or drink after midnight the night prior to surgery except that she may have only water until 2 hours before surgery with nothing to drink within 2 hours of surgery.  The patient states she currently takes aspirin 81mg  & was informed to hold medication for 7 days prior to surgery beginning on 12/29/2018. Patient's  questions were answered and she expressed understanding of these instructions.

## 2018-12-24 ENCOUNTER — Other Ambulatory Visit: Payer: Self-pay

## 2018-12-24 ENCOUNTER — Encounter
Admission: RE | Admit: 2018-12-24 | Discharge: 2018-12-24 | Disposition: A | Discharge status: Home / Self Care | Payer: Medicare PPO | Source: Ambulatory Visit | Attending: Urology | Admitting: Urology

## 2018-12-24 DIAGNOSIS — I10 Essential (primary) hypertension: Secondary | ICD-10-CM

## 2018-12-24 DIAGNOSIS — Z01818 Encounter for other preprocedural examination: Secondary | ICD-10-CM | POA: Diagnosis not present

## 2018-12-24 HISTORY — DX: Psoriasis, unspecified: L40.9

## 2018-12-24 HISTORY — DX: Prediabetes: R73.03

## 2018-12-24 HISTORY — DX: Gastro-esophageal reflux disease without esophagitis: K21.9

## 2018-12-24 HISTORY — DX: Anemia, unspecified: D64.9

## 2018-12-24 NOTE — Patient Instructions (Signed)
Your procedure is scheduled on: Tuesday 01/05/19 Report to [facility]. To find out your arrival time please call [phone] between 1PM - 3PM on Monday 01/04/19.  Remember: Instructions that are not followed completely may result in serious medical risk, up to and including death, or upon the discretion of your surgeon and anesthesiologist your surgery may need to be rescheduled.     _X__ 1. Do not eat food after midnight the night before your procedure.                 No gum chewing or hard candies. You may drink clear liquids up to 2 hours                 before you are scheduled to arrive for your surgery- DO not drink clear                 liquids within 2 hours of the start of your surgery.                 Clear Liquids include:  water, apple juice without pulp, clear carbohydrate                 drink such as Clearfast or Gatorade, Black Coffee or Tea (Do not add                 anything to coffee or tea).  __X__2.  On the morning of surgery brush your teeth with toothpaste and water, you                 may rinse your mouth with mouthwash if you wish.  Do not swallow any              toothpaste of mouthwash.     _X__ 3.  No Alcohol for 24 hours before or after surgery.   _X__ 4.  Do Not Smoke or use e-cigarettes For 24 Hours Prior to Your Surgery.                 Do not use any chewable tobacco products for at least 6 hours prior to                 surgery.  ____  5.  Bring all medications with you on the day of surgery if instructed.   __X__  6.  Notify your doctor if there is any change in your medical condition      (cold, fever, infections).     Do not wear jewelry, make-up, hairpins, clips or nail polish. Do not wear lotions, powders, or perfumes.  Do not shave 48 hours prior to surgery. Men may shave face and neck. Do not bring valuables to the hospital.    [facility] is not responsible for any belongings or  valuables.  Contacts, dentures/partials or body piercings may not be worn into surgery. Bring a case for your contacts, glasses or hearing aids, a denture cup will be supplied. Leave your suitcase in the car. After surgery it may be brought to your room. For patients admitted to the hospital, discharge time is determined by your treatment team.   Patients discharged the day of surgery will not be allowed to drive home.   Please read over the following fact sheets that you were given:   MRSA Information  __X__ Take these medicines the morning of surgery with A SIP OF WATER:  1. amLODipine (NORVASC)   2. omeprazole (PRILOSEC)  3.   4.  5.  6.  ____ Fleet Enema (as directed)   ____ Use CHG Soap/SAGE wipes as directed  ____ Use inhalers on the day of surgery  ____ Stop metformin/Janumet/Farxiga 2 days prior to surgery    ____ Take 1/2 of usual insulin dose the night before surgery. No insulin the morning          of surgery.   ____ Stop Blood Thinners Coumadin/Plavix/Xarelto/Pleta/Pradaxa/Eliquis/Effient/Aspirin  on   Or contact your Surgeon, Cardiologist or Medical Doctor regarding  ability to stop your blood thinners  __X__ Stop Anti-inflammatories 7 days before surgery such as Advil, Ibuprofen, Motrin,  BC or Goodies Powder, Naprosyn, Naproxen, Aleve, Aspirin and Meloxicam   __X__ Stop all herbal supplements, fish oil or vitamin E until after surgery.    ____ Bring C-Pap to the hospital.

## 2018-12-26 LAB — URINE CULTURE: Culture: NO GROWTH

## 2019-01-04 MED ORDER — CEFAZOLIN SODIUM-DEXTROSE 2-4 GM/100ML-% IV SOLN
2.0000 g | INTRAVENOUS | Status: AC
  Administered 2019-01-05: 2 g via INTRAVENOUS

## 2019-01-04 NOTE — Pre-Procedure Instructions (Signed)
CLEARANCE ON CHART FROM PCP

## 2019-01-05 ENCOUNTER — Telehealth: Payer: Self-pay | Admitting: Urology

## 2019-01-05 ENCOUNTER — Other Ambulatory Visit: Payer: Self-pay

## 2019-01-05 ENCOUNTER — Ambulatory Visit: Payer: Medicare PPO | Admitting: Anesthesiology

## 2019-01-05 ENCOUNTER — Encounter
Admission: RE | Disposition: A | Discharge status: Home / Self Care | Payer: Self-pay | Source: Home / Self Care | Attending: Urology

## 2019-01-05 ENCOUNTER — Ambulatory Visit
Admission: RE | Admit: 2019-01-05 | Discharge: 2019-01-05 | Disposition: A | Discharge status: Home / Self Care | Payer: Medicare PPO | Attending: Urology | Admitting: Urology

## 2019-01-05 ENCOUNTER — Other Ambulatory Visit: Payer: Self-pay | Admitting: Urology

## 2019-01-05 ENCOUNTER — Encounter: Payer: Self-pay | Admitting: *Deleted

## 2019-01-05 DIAGNOSIS — D494 Neoplasm of unspecified behavior of bladder: Secondary | ICD-10-CM

## 2019-01-05 DIAGNOSIS — C674 Malignant neoplasm of posterior wall of bladder: Secondary | ICD-10-CM | POA: Insufficient documentation

## 2019-01-05 DIAGNOSIS — D649 Anemia, unspecified: Secondary | ICD-10-CM | POA: Diagnosis not present

## 2019-01-05 DIAGNOSIS — I10 Essential (primary) hypertension: Secondary | ICD-10-CM | POA: Insufficient documentation

## 2019-01-05 DIAGNOSIS — Z79899 Other long term (current) drug therapy: Secondary | ICD-10-CM | POA: Diagnosis not present

## 2019-01-05 DIAGNOSIS — L409 Psoriasis, unspecified: Secondary | ICD-10-CM | POA: Insufficient documentation

## 2019-01-05 DIAGNOSIS — E785 Hyperlipidemia, unspecified: Secondary | ICD-10-CM | POA: Diagnosis not present

## 2019-01-05 DIAGNOSIS — K219 Gastro-esophageal reflux disease without esophagitis: Secondary | ICD-10-CM | POA: Insufficient documentation

## 2019-01-05 DIAGNOSIS — Z7982 Long term (current) use of aspirin: Secondary | ICD-10-CM | POA: Diagnosis not present

## 2019-01-05 DIAGNOSIS — Z791 Long term (current) use of non-steroidal anti-inflammatories (NSAID): Secondary | ICD-10-CM | POA: Insufficient documentation

## 2019-01-05 HISTORY — PX: TRANSURETHRAL RESECTION OF BLADDER TUMOR WITH MITOMYCIN-C: SHX6459

## 2019-01-05 SURGERY — TRANSURETHRAL RESECTION OF BLADDER TUMOR WITH MITOMYCIN-C
Anesthesia: General | Site: Bladder

## 2019-01-05 MED ORDER — MIDAZOLAM HCL 2 MG/2ML IJ SOLN
INTRAMUSCULAR | Status: DC | PRN
  Administered 2019-01-05: 2 mg via INTRAVENOUS

## 2019-01-05 MED ORDER — OXYBUTYNIN CHLORIDE 5 MG PO TABS: ORAL_TABLET | ORAL | 0 refills | Status: DC

## 2019-01-05 MED ORDER — LIDOCAINE HCL (PF) 2 % IJ SOLN
INTRAMUSCULAR | Status: AC
  Filled 2019-01-05: qty 10

## 2019-01-05 MED ORDER — GEMCITABINE CHEMO FOR BLADDER INSTILLATION 2000 MG
INTRAVENOUS | Status: DC | PRN
  Administered 2019-01-05: 2000 mg via INTRAVESICAL

## 2019-01-05 MED ORDER — MIDAZOLAM HCL 2 MG/2ML IJ SOLN
INTRAMUSCULAR | Status: AC
  Filled 2019-01-05: qty 2

## 2019-01-05 MED ORDER — FENTANYL CITRATE (PF) 100 MCG/2ML IJ SOLN: 25.0000 ug | INTRAMUSCULAR | Status: DC | PRN

## 2019-01-05 MED ORDER — PROMETHAZINE HCL 25 MG/ML IJ SOLN: 6.2500 mg | INTRAMUSCULAR | Status: DC | PRN

## 2019-01-05 MED ORDER — PROPOFOL 10 MG/ML IV BOLUS
INTRAVENOUS | Status: AC
  Filled 2019-01-05: qty 20

## 2019-01-05 MED ORDER — FAMOTIDINE 20 MG PO TABS
20.0000 mg | ORAL_TABLET | Freq: Once | ORAL | Status: AC
  Administered 2019-01-05: 20 mg via ORAL

## 2019-01-05 MED ORDER — FAMOTIDINE 20 MG PO TABS
ORAL_TABLET | ORAL | Status: AC
  Filled 2019-01-05: qty 1

## 2019-01-05 MED ORDER — PHENYLEPHRINE HCL 10 MG/ML IJ SOLN
INTRAMUSCULAR | Status: DC | PRN
  Administered 2019-01-05 (×2): 50 ug via INTRAVENOUS
  Administered 2019-01-05 (×3): 150 ug via INTRAVENOUS
  Administered 2019-01-05: 100 ug via INTRAVENOUS

## 2019-01-05 MED ORDER — EPHEDRINE SULFATE 50 MG/ML IJ SOLN
INTRAMUSCULAR | Status: DC | PRN
  Administered 2019-01-05: 5 mg via INTRAVENOUS

## 2019-01-05 MED ORDER — OXYCODONE HCL 5 MG/5ML PO SOLN: 5.0000 mg | Freq: Once | ORAL | Status: DC | PRN

## 2019-01-05 MED ORDER — DEXAMETHASONE SODIUM PHOSPHATE 10 MG/ML IJ SOLN
INTRAMUSCULAR | Status: AC
  Filled 2019-01-05: qty 1

## 2019-01-05 MED ORDER — OXYCODONE HCL 5 MG PO TABS: 5.0000 mg | ORAL_TABLET | Freq: Once | ORAL | Status: DC | PRN

## 2019-01-05 MED ORDER — SEVOFLURANE IN SOLN
RESPIRATORY_TRACT | Status: AC
  Filled 2019-01-05: qty 250

## 2019-01-05 MED ORDER — PROPOFOL 10 MG/ML IV BOLUS
INTRAVENOUS | Status: DC | PRN
  Administered 2019-01-05: 150 mg via INTRAVENOUS

## 2019-01-05 MED ORDER — DEXAMETHASONE SODIUM PHOSPHATE 10 MG/ML IJ SOLN
INTRAMUSCULAR | Status: DC | PRN
  Administered 2019-01-05: 10 mg via INTRAVENOUS

## 2019-01-05 MED ORDER — FENTANYL CITRATE (PF) 100 MCG/2ML IJ SOLN
INTRAMUSCULAR | Status: AC
  Filled 2019-01-05: qty 2

## 2019-01-05 MED ORDER — MEPERIDINE HCL 50 MG/ML IJ SOLN: 6.2500 mg | INTRAMUSCULAR | Status: DC | PRN

## 2019-01-05 MED ORDER — CEFAZOLIN SODIUM-DEXTROSE 2-4 GM/100ML-% IV SOLN
INTRAVENOUS | Status: AC
  Filled 2019-01-05: qty 100

## 2019-01-05 MED ORDER — ONDANSETRON HCL 4 MG/2ML IJ SOLN
INTRAMUSCULAR | Status: DC | PRN
  Administered 2019-01-05: 4 mg via INTRAVENOUS

## 2019-01-05 MED ORDER — LACTATED RINGERS IV SOLN
INTRAVENOUS | Status: DC
  Administered 2019-01-05: 08:00:00 via INTRAVENOUS

## 2019-01-05 MED ORDER — ONDANSETRON HCL 4 MG/2ML IJ SOLN
INTRAMUSCULAR | Status: AC
  Filled 2019-01-05: qty 2

## 2019-01-05 MED ORDER — FENTANYL CITRATE (PF) 100 MCG/2ML IJ SOLN
INTRAMUSCULAR | Status: DC | PRN
  Administered 2019-01-05 (×6): 25 ug via INTRAVENOUS

## 2019-01-05 MED ORDER — EPHEDRINE SULFATE 50 MG/ML IJ SOLN
INTRAMUSCULAR | Status: AC
  Filled 2019-01-05: qty 1

## 2019-01-05 MED ORDER — LIDOCAINE HCL (CARDIAC) PF 100 MG/5ML IV SOSY
PREFILLED_SYRINGE | INTRAVENOUS | Status: DC | PRN
  Administered 2019-01-05: 100 mg via INTRAVENOUS

## 2019-01-05 MED ORDER — PHENYLEPHRINE HCL 10 MG/ML IJ SOLN
INTRAMUSCULAR | Status: AC
  Filled 2019-01-05: qty 1

## 2019-01-05 SURGICAL SUPPLY — 23 items
BAG DRAIN CYSTO-URO LG1000N (MISCELLANEOUS) ×2 IMPLANT
BAG URINE DRAINAGE (UROLOGICAL SUPPLIES) ×2 IMPLANT
CATH FOLEY 2WAY  5CC 16FR (CATHETERS)
CATH URTH 16FR FL 2W BLN LF (CATHETERS) IMPLANT
DRAPE UTILITY 15X26 TOWEL STRL (DRAPES) ×2 IMPLANT
DRSG TELFA 4X3 1S NADH ST (GAUZE/BANDAGES/DRESSINGS) ×2 IMPLANT
ELECT BIVAP BIPO 22/24 DONUT (ELECTROSURGICAL) ×2
ELECT LOOP 22F BIPOLAR SML (ELECTROSURGICAL) ×2
ELECT REM PT RETURN 9FT ADLT (ELECTROSURGICAL)
ELECTRD BIVAP BIPO 22/24 DONUT (ELECTROSURGICAL) IMPLANT
ELECTRODE LOOP 22F BIPOLAR SML (ELECTROSURGICAL) IMPLANT
ELECTRODE REM PT RTRN 9FT ADLT (ELECTROSURGICAL) IMPLANT
GLOVE BIO SURGEON STRL SZ8 (GLOVE) ×2 IMPLANT
GOWN STRL REUS W/ TWL LRG LVL3 (GOWN DISPOSABLE) ×1 IMPLANT
GOWN STRL REUS W/TWL LRG LVL3 (GOWN DISPOSABLE) ×1
GOWN STRL REUS W/TWL XL LVL4 (GOWN DISPOSABLE) ×2 IMPLANT
KIT TURNOVER CYSTO (KITS) ×2 IMPLANT
LOOP CUT BIPOLAR 24F LRG (ELECTROSURGICAL) IMPLANT
PACK CYSTO AR (MISCELLANEOUS) ×2 IMPLANT
SET IRRIG Y TYPE TUR BLADDER L (SET/KITS/TRAYS/PACK) ×2 IMPLANT
SURGILUBE 2OZ TUBE FLIPTOP (MISCELLANEOUS) ×2 IMPLANT
SYRINGE IRR TOOMEY STRL 70CC (SYRINGE) ×2 IMPLANT
WATER STERILE IRR 1000ML POUR (IV SOLUTION) ×2 IMPLANT

## 2019-01-05 NOTE — Anesthesia Postprocedure Evaluation (Signed)
Anesthesia Post Note  Patient: Marie Bates  Procedure(s) Performed: TRANSURETHRAL RESECTION OF BLADDER TUMOR WITH Gemcitabine (N/A Bladder)  Patient location during evaluation: PACU Anesthesia Type: General Level of consciousness: awake and alert and oriented Pain management: pain level controlled Vital Signs Assessment: post-procedure vital signs reviewed and stable Respiratory status: spontaneous breathing, nonlabored ventilation and respiratory function stable Cardiovascular status: blood pressure returned to baseline and stable Postop Assessment: no signs of nausea or vomiting Anesthetic complications: no     Last Vitals:  Vitals:   01/05/19 1041 01/05/19 1056  BP: (!) 152/80 (!) 157/76  Pulse: 73 74  Resp: 17 16  Temp:    SpO2: 100% 96%    Last Pain:  Vitals:   01/05/19 1056  TempSrc:   PainSc: 0-No pain                 Ameirah Khatoon

## 2019-01-05 NOTE — Discharge Instructions (Signed)

## 2019-01-05 NOTE — Anesthesia Preprocedure Evaluation (Signed)
Anesthesia Evaluation  Patient identified by MRN, date of birth, ID band Patient awake    Reviewed: Allergy & Precautions, NPO status , Patient's Chart, lab work & pertinent test results  History of Anesthesia Complications Negative for: history of anesthetic complications  Airway Mallampati: II  TM Distance: >3 FB Neck ROM: Full    Dental  (+) Missing, Poor Dentition   Pulmonary neg pulmonary ROS, neg sleep apnea, neg COPD,    breath sounds clear to auscultation- rhonchi (-) wheezing      Cardiovascular Exercise Tolerance: Good hypertension, Pt. on medications (-) CAD, (-) Past MI, (-) Cardiac Stents and (-) CABG  Rhythm:Regular Rate:Normal - Systolic murmurs and - Diastolic murmurs    Neuro/Psych neg Seizures negative neurological ROS  negative psych ROS   GI/Hepatic Neg liver ROS, GERD  ,  Endo/Other  negative endocrine ROSneg diabetes  Renal/GU negative Renal ROS     Musculoskeletal negative musculoskeletal ROS (+)   Abdominal (+) + obese,   Peds  Hematology  (+) anemia ,   Anesthesia Other Findings Past Medical History: No date: Anemia No date: Cirrhosis (HCC)     Comment:  pt denies No date: GERD (gastroesophageal reflux disease) No date: Hyperlipemia No date: Hypertension No date: Pre-diabetes No date: Psoriasis   Reproductive/Obstetrics                             Anesthesia Physical Anesthesia Plan  ASA: II  Anesthesia Plan: General   Post-op Pain Management:    Induction: Intravenous  PONV Risk Score and Plan: 2 and Ondansetron and Dexamethasone  Airway Management Planned: LMA  Additional Equipment:   Intra-op Plan:   Post-operative Plan:   Informed Consent: I have reviewed the patients History and Physical, chart, labs and discussed the procedure including the risks, benefits and alternatives for the proposed anesthesia with the patient or authorized  representative who has indicated his/her understanding and acceptance.   Dental advisory given  Plan Discussed with: CRNA and Anesthesiologist  Anesthesia Plan Comments:         Anesthesia Quick Evaluation

## 2019-01-05 NOTE — Op Note (Signed)
Preoperative diagnosis: 1. Bladder tumor (2.5 cm)  Postoperative diagnosis:  1. Bladder tumor (2.5 cm)  Procedure:  1. Cystoscopy 2. Transurethral resection of bladder tumor (2.5 cm) 3. Instillation intravesical gemcitabine  Surgeon: Nicki Reaper C. Ziasia Lenoir, M.D.  Anesthesia: General  Complications: None  Intraoperative findings:  1. Bladder tumor: 2.5 cm papillary bladder tumor left upper posterior wall   EBL: Minimal  Specimens: 1. Bladder tumor   Indication: Marie Bates is a 73 year old female with a history of gross hematuria and a >2 cm left posterior wall bladder tumor. After reviewing the management options for treatment, he elected to proceed with the above surgical procedure(s). We have discussed the potential benefits and risks of the procedure, side effects of the proposed treatment, the likelihood of the patient achieving the goals of the procedure, and any potential problems that might occur during the procedure or recuperation. Informed consent has been obtained.  Description of procedure:  The patient was taken to the operating room and general anesthesia was induced.  The patient was placed in the dorsal lithotomy position, prepped and draped in the usual sterile fashion, and preoperative antibiotics were administered. A preoperative time-out was performed.   Cystourethroscopy was performed.  The patient's urethra was examined and was normal.  The bladder was then systematically examined in its entirety.  The ureteral orifice ease were normal in appearance with clear efflux.  On the left upper posterior wall a papillary bladder tumor was identified.  No other mucosal abnormalities were noted.  A 26 French continuous-flow resectoscope sheath with obturator was lubricated and passed per urethra.    The bladder was then re-examined after the resectoscope was placed.  The bladder tumor was >2 cm.  It was located on the left upper posterior wall and appeared to be  superficial. Using loop cautery resection, the entire tumor was resected and removed for permanent pathologic analysis.   Hemostasis was then achieved with the loop cautery and the bladder was emptied and reinspected with no further bleeding noted at the end of the procedure.    The bladder was then emptied and the procedure ended.  The patient appeared to tolerate the procedure well and without complications.    A 16 French Foley catheter was placed with return of clear effluent upon irrigation.  The catheter was placed to a close drainage system and the tubing was clamped.  2000 mg of gemcitabine was instilled and will remain indwelling for 1 hour.  After anesthetic reversal the patient was transported to the PACU in stable condition.  Plan: Follow-up 48 hours [place] for Foley catheter removal   [name] C. Bernardo Heater,  MD

## 2019-01-05 NOTE — Telephone Encounter (Signed)
Got a call from post op about a cath removal 1 day after surgery I made her an app for tomorrow is that ok to have the cath removed no V&T?   Sharyn Lull

## 2019-01-05 NOTE — H&P (Signed)
01/05/2019 7:16 AM   Marie Bates Marie Bates 08/22/1946 [id number]   HPI: 73 year old female with onset of intermittent total gross painless hematuria since mid November 2019.  CT urogram showed a 2.5 cm posterior wall bladder mass and cystoscopy was remarkable for a >2 cm papillary tumor left posterior wall.  She presents for TURBT.   PMH: Past Medical History:  Diagnosis Date  . Anemia   . Cirrhosis ([place])    pt denies  . GERD (gastroesophageal reflux disease)   . Hyperlipemia   . Hypertension   . Pre-diabetes   . Psoriasis     Surgical History: Past Surgical History:  Procedure Laterality Date  . ABDOMINAL HYSTERECTOMY    . COLONOSCOPY      Home Medications:    amLODipine 10 MG tablet Commonly known as:  NORVASC TAKE 1 TABLET BY MOUTH ONCE DAILY   Apremilast 30 MG Tabs Take by mouth.   aspirin EC 81 MG tablet Take by mouth.   atorvastatin 40 MG tablet Commonly known as:  LIPITOR TAKE 1 TABLET BY MOUTH ONCE DAILY   clotrimazole-betamethasone cream Commonly known as:  LOTRISONE Apply topically.   cyanocobalamin 1000 MCG/ML injection Commonly known as:  (VITAMIN B-12) Inject into the muscle.   ferrous sulfate 325 (65 FE) MG tablet Take by mouth.   hydrochlorothiazide 25 MG tablet Commonly known as:  HYDRODIURIL take 1 tablet by mouth once daily   meloxicam 15 MG tablet Commonly known as:  MOBIC Take 15 mg by mouth daily.   omeprazole 20 MG capsule Commonly known as:  PRILOSEC TAKE 1 CAPSULE BY MOUTH ONCE DAILY   ondansetron 4 MG tablet Commonly known as:  ZOFRAN Take 1 tablet (4 mg total) by mouth daily as needed for nausea or vomiting.   polyethylene glycol packet Commonly known as:  MIRALAX / GLYCOLAX Take 17 g by mouth daily.   traZODone 50 MG tablet Commonly known as:  DESYREL Take by mouth.   Vitamin D3 50 MCG (2000 UT) capsule Take by mouth.    Allergies: No Known Allergies  Family History: Family History  Problem  Relation Age of Onset  . Breast cancer Neg Hx     Social History:  reports that she has never smoked. She has never used smokeless tobacco. She reports that she does not drink alcohol or use drugs.  ROS: No significant change from 11/26/2018  Physical Exam: BP (!) 182/84   Pulse 77   Temp 98.2 F (36.8 C) (Oral)   Resp 18   Ht 5\' 8"  (1.727 m)   Wt 99 kg   SpO2 98%   BMI 33.19 kg/m   Constitutional:  Alert and oriented, No acute distress. HEENT: [place] AT, moist mucus membranes.  Trachea midline, no masses. Cardiovascular: No clubbing, cyanosis, or edema.  RRR Respiratory: Normal respiratory effort, no increased work of breathing.  Lungs clear GI: Abdomen is soft, nontender, nondistended, no abdominal masses GU: No CVA tenderness Lymph: No cervical or inguinal lymphadenopathy. Skin: No rashes, bruises or suspicious lesions. Neurologic: Grossly intact, no focal deficits, moving all 4 extremities. Psychiatric: Normal mood and affect.  Pertinent Imaging:  Results for orders placed during the hospital encounter of 12/07/18  CT HEMATURIA WORKUP   Narrative CLINICAL DATA:  73 year old female with history of gross hematuria in November 2019. No associated abdominal or pelvic pain.  EXAM: CT ABDOMEN AND PELVIS WITHOUT AND WITH CONTRAST  TECHNIQUE: Multidetector CT imaging of the abdomen and pelvis was performed following the standard  protocol before and following the bolus administration of intravenous contrast.  CONTRAST:  181mL ISOVUE-300 IOPAMIDOL (ISOVUE-300) INJECTION 61%  COMPARISON:  None.  FINDINGS: Lower chest: Aortic atherosclerosis.  Hepatobiliary: No suspicious cystic or solid hepatic lesions. No intra or extrahepatic biliary ductal dilatation. Gallbladder is normal in appearance.  Pancreas: No pancreatic mass. No pancreatic ductal dilatation. No pancreatic or peripancreatic fluid or inflammatory changes.  Spleen: Unremarkable.  Adrenals/Urinary Tract: 2 mm  nonobstructive calculus in the upper pole collecting system of the left kidney. No additional calculi are noted within the collecting system of the right kidney, along the course of either ureter, or within the lumen of the urinary bladder. Numerous subcentimeter low-attenuation lesions are noted in both kidneys, too small to characterize. In the upper pole of the right kidney there is a 2 cm low-intermediate attenuation lesion with equivocal enhancement (20 HU on precontrast images and 30 HU on post-contrast images) and potential internal septations, considered indeterminate. Two other smaller similar appearing indeterminate lesions are also noted in the upper pole of the right kidney. Multifocal cortical thinning in the right kidney. In the anterior aspect of the urinary bladder best appreciated on axial image 78 of series 9 and coronal image 67 of series 12 there is a 1.6 x 2.4 x 2.3 cm lesion which demonstrates enhancement and corresponds to a filling defect on delayed post-contrast images, highly concerning for primary urothelial neoplasm. Bilateral adrenal glands are normal in appearance.  Stomach/Bowel: Normal appearance of the stomach. No pathologic dilatation of small bowel or colon. Appendix is not confidently identified likely surgically absent.  Vascular/Lymphatic: Aortic atherosclerosis, without evidence of aneurysm or dissection in the abdominal or pelvic vasculature.  Reproductive: Status post hysterectomy. Ovaries are not confidently identified may be surgically absent.  Other: In the lower right anterior hemipelvis (axial image 71 of series 9 and coronal image 51 of series 12) there is a mixed fatty attenuation and soft tissue attenuation lesion measuring 5.4 x 6.3 x 5.6 cm which demonstrates some low-level internal enhancement. This is smoothly marginated.  Musculoskeletal: There are no aggressive appearing lytic or blastic lesions noted in the visualized  portions of the skeleton.  IMPRESSION: 1. 1.6 x 2.4 x 2.3 cm lesion in the anterior aspect of the urinary bladder, highly concerning for primary urothelial neoplasm. Correlation with cystoscopy is suggested if clinically appropriate. 2. In the anterior aspect of the low right hemipelvis there is an indeterminate 5.4 x 6.3 x 5.6 cm mass which demonstrates internal fatty attenuation and soft tissue attenuation with some internal enhancement. If the patient has had prior ovariopexy, the possibility of an ovarian dermoid warrants consideration. The other primary differential consideration is that of a liposarcoma. Further evaluation with PET-CT could be considered. At the very least, close attention on future follow-up studies is recommended to ensure stability of this finding. 3. Several indeterminate lesions are noted in the upper pole of the right kidney. Further evaluation with nonemergent MRI of the abdomen with and without IV gadolinium is recommended in the near future to provide definitive characterization. 4. 2 mm nonobstructive calculus in the upper pole collecting system of the left kidney. No ureteral stones or findings of urinary tract obstruction are noted at this time. 5. Aortic atherosclerosis.   Electronically Signed   By: Vinnie Langton M.D.   On: 12/07/2018 15:35      Assessment & Plan:   73 year old female with a posterior bladder wall tumor suspicious for urothelial carcinoma.  She presents for TURBT.  The procedure has been discussed in detail including potential risks of bleeding, infection, bladder injury as well as anesthetic risk.  We will plan on post resection intravesical gemcitabine.  She indicated all questions were answered to her satisfaction and desires to proceed.  Abbie Sons, [facility] [address], [place], [place] [address] [phone]

## 2019-01-05 NOTE — Anesthesia Post-op Follow-up Note (Signed)
Anesthesia QCDR form completed.        

## 2019-01-05 NOTE — Anesthesia Procedure Notes (Signed)
Procedure Name: LMA Insertion Date/Time: 01/05/2019 9:24 AM Performed by: Lavone Orn, CRNA Pre-anesthesia Checklist: Patient identified, Emergency Drugs available, Suction available, Patient being monitored and Timeout performed Patient Re-evaluated:Patient Re-evaluated prior to induction Oxygen Delivery Method: Circle system utilized Preoxygenation: Pre-oxygenation with 100% oxygen Induction Type: IV induction Ventilation: Mask ventilation without difficulty LMA: LMA inserted LMA Size: 4.0 Number of attempts: 1 Placement Confirmation: positive ETCO2 and breath sounds checked- equal and bilateral Tube secured with: Tape Dental Injury: Teeth and Oropharynx as per pre-operative assessment

## 2019-01-05 NOTE — Interval H&P Note (Signed)
History and Physical Interval Note:  01/05/2019 8:24 AM  Marie Bates  has presented today for surgery, with the diagnosis of bladder tumor  The various methods of treatment have been discussed with the patient and family. After consideration of risks, benefits and other options for treatment, the patient has consented to  Procedure(s): TRANSURETHRAL RESECTION OF BLADDER TUMOR WITH Gemcitabine (N/A) as a surgical intervention .  The patient's history has been reviewed, patient examined, no change in status, stable for surgery.  I have reviewed the patient's chart and labs.  Questions were answered to the patient's satisfaction.     [place]

## 2019-01-05 NOTE — Telephone Encounter (Signed)
It was supposed to be 2 days after surgery.  Would schedule on Thursday.  Removal only is fine

## 2019-01-05 NOTE — Progress Notes (Signed)
Changed drainage bag to new catheter bag, to send home with leg bag also as ordered by MD.

## 2019-01-05 NOTE — Transfer of Care (Signed)
Immediate Anesthesia Transfer of Care Note  Patient: Marie Bates  Procedure(s) Performed: TRANSURETHRAL RESECTION OF BLADDER TUMOR WITH Gemcitabine (N/A Bladder)  Patient Location: PACU  Anesthesia Type:General  Level of Consciousness: drowsy  Airway & Oxygen Therapy: Patient Spontanous Breathing and Patient connected to face mask oxygen  Post-op Assessment: Report given to RN and Post -op Vital signs reviewed and stable  Post vital signs: stable  Last Vitals:  Vitals Value Taken Time  BP 153/87 01/05/2019 10:26 AM  Temp 36.8 C 01/05/2019 10:26 AM  Pulse 74 01/05/2019 10:29 AM  Resp 20 01/05/2019 10:29 AM  SpO2 100 % 01/05/2019 10:29 AM  Vitals shown include unvalidated device data.  Last Pain:  Vitals:   01/05/19 1026  TempSrc:   PainSc: Asleep         Complications: No apparent anesthesia complications

## 2019-01-06 ENCOUNTER — Ambulatory Visit: Payer: Medicare PPO

## 2019-01-06 ENCOUNTER — Encounter: Payer: Self-pay | Admitting: Urology

## 2019-01-06 ENCOUNTER — Other Ambulatory Visit: Payer: Self-pay

## 2019-01-06 LAB — SURGICAL PATHOLOGY

## 2019-01-06 MED ORDER — OXYBUTYNIN CHLORIDE 5 MG PO TABS: ORAL_TABLET | ORAL | 3 refills | Status: DC

## 2019-01-07 ENCOUNTER — Ambulatory Visit: Payer: Medicare PPO

## 2019-01-07 DIAGNOSIS — D494 Neoplasm of unspecified behavior of bladder: Secondary | ICD-10-CM

## 2019-01-07 NOTE — Progress Notes (Signed)
Catheter Removal  Patient is present today for a catheter removal.  93ml of water was drained from the balloon. A 16FR foley cath was removed from the bladder no complications were noted . Patient tolerated well.  Preformed by: Alva Garnet   Follow up/ Additional notes: keep post op follow up

## 2019-01-19 ENCOUNTER — Encounter: Payer: Self-pay | Admitting: Urology

## 2019-01-28 ENCOUNTER — Ambulatory Visit (INDEPENDENT_AMBULATORY_CARE_PROVIDER_SITE_OTHER): Payer: Medicare PPO | Admitting: Urology

## 2019-01-28 ENCOUNTER — Encounter: Payer: Self-pay | Admitting: Urology

## 2019-01-28 VITALS — BP 146/70 | HR 71 | Ht 68.0 in | Wt 210.0 lb

## 2019-01-28 DIAGNOSIS — C679 Malignant neoplasm of bladder, unspecified: Secondary | ICD-10-CM

## 2019-01-28 NOTE — Progress Notes (Signed)
01/28/2019 10:31 AM   Marie Bates [birth year] [id number]  Referring provider: Marinda Elk, MD [facility] [place] [address]  Chief Complaint  Patient presents with  . Routine Post Op    HPI: 73 year old female presents for postop follow-up.  She underwent TURBT for a 2.5 cm papillary bladder tumor of the left posterior wall on 01/05/2019.  She received post resection intravesical gemcitabine.  She had no postoperative problems and has no complaints today.  Pathology: Low-grade papillary urothelial carcinoma, noninvasive   PMH: Past Medical History:  Diagnosis Date  . Anemia   . Cirrhosis ([place])    pt denies  . GERD (gastroesophageal reflux disease)   . Hyperlipemia   . Hypertension   . Pre-diabetes   . Psoriasis     Surgical History: Past Surgical History:  Procedure Laterality Date  . ABDOMINAL HYSTERECTOMY    . COLONOSCOPY    . TRANSURETHRAL RESECTION OF BLADDER TUMOR WITH MITOMYCIN-C N/A 01/05/2019   Procedure: TRANSURETHRAL RESECTION OF BLADDER TUMOR WITH Gemcitabine;  Surgeon: Abbie Sons, MD;  Location: [facility] ORS;  Service: Urology;  Laterality: N/A;    Home Medications:  Allergies as of 01/28/2019   No Known Allergies     Medication List       Accurate as of January 28, 2019 10:31 AM. Always use your most recent med list.        amLODipine 10 MG tablet Commonly known as:  NORVASC Take 10 mg by mouth daily.   aspirin EC 81 MG tablet Take 81 mg by mouth daily.   atorvastatin 40 MG tablet Commonly known as:  LIPITOR Take 40 mg by mouth at bedtime.   ferrous sulfate 325 (65 FE) MG tablet Take 325 mg by mouth daily.   hydrochlorothiazide 25 MG tablet Commonly known as:  HYDRODIURIL Take 25 mg by mouth daily.   meloxicam 15 MG tablet Commonly known as:  MOBIC Take 15 mg by mouth daily.   omeprazole 20 MG capsule Commonly known as:  PRILOSEC Take 20 mg by mouth daily.   oxybutynin 5 MG  tablet Commonly known as:  DITROPAN 1 tab tid prn frequency,urgency, bladder spasm   SIMILASAN DRY EYE RELIEF OP Place 2 drops into both eyes daily as needed (for dry eyes).       Allergies: No Known Allergies  Family History: Family History  Problem Relation Age of Onset  . Breast cancer Neg Hx     Social History:  reports that she has never smoked. She has never used smokeless tobacco. She reports that she does not drink alcohol or use drugs.  ROS: UROLOGY Frequent Urination?: No Hard to postpone urination?: No Burning/pain with urination?: No Get up at night to urinate?: No Leakage of urine?: No Urine stream starts and stops?: No Trouble starting stream?: No Do you have to strain to urinate?: No Blood in urine?: No Urinary tract infection?: No Sexually transmitted disease?: No Injury to kidneys or bladder?: No Painful intercourse?: No Weak stream?: No Currently pregnant?: No Vaginal bleeding?: No Last menstrual period?: Postmenpausal  Gastrointestinal Nausea?: No Vomiting?: No Indigestion/heartburn?: No Diarrhea?: No Constipation?: No  Constitutional Fever: No Night sweats?: No Weight loss?: No Fatigue?: No  Skin Skin rash/lesions?: No Itching?: No  Eyes Blurred vision?: No Double vision?: No  Ears/Nose/Throat Sore throat?: No Sinus problems?: No  Hematologic/Lymphatic Swollen glands?: No Easy bruising?: No  Cardiovascular Leg swelling?: No Chest pain?: No  Respiratory Cough?: No Shortness  of breath?: No  Endocrine Excessive thirst?: No  Musculoskeletal Back pain?: No Joint pain?: No  Neurological Headaches?: No Dizziness?: No  Psychologic Depression?: No Anxiety?: No  Physical Exam: BP (!) 146/70 (BP Location: Left Arm, Patient Position: Sitting, Cuff Size: Large)   Pulse 71   Ht 5\' 8"  (1.727 m)   Wt 210 lb (95.3 kg)   BMI 31.93 kg/m   Constitutional:  Alert and oriented, No acute distress.   Assessment & Plan:    73 year old female with low risk Ta urothelial carcinoma the bladder status post TURBT.  We discussed the pathology report in detail and the recommendations of periodic surveillance cystoscopies.  Return in about 3 months (around 04/28/2019) for Cystoscopy.   Abbie Sons, [place] [address], [place], [place] [address] [phone]

## 2019-03-25 ENCOUNTER — Other Ambulatory Visit: Payer: Self-pay | Admitting: Physician Assistant

## 2019-03-25 DIAGNOSIS — Z1231 Encounter for screening mammogram for malignant neoplasm of breast: Secondary | ICD-10-CM

## 2019-04-30 ENCOUNTER — Other Ambulatory Visit: Payer: Medicare PPO | Admitting: Urology

## 2019-05-26 ENCOUNTER — Ambulatory Visit
Admission: RE | Admit: 2019-05-26 | Discharge: 2019-05-26 | Disposition: A | Discharge status: Home / Self Care | Payer: Medicare PPO | Source: Ambulatory Visit | Attending: Physician Assistant | Admitting: Physician Assistant

## 2019-05-26 ENCOUNTER — Other Ambulatory Visit: Payer: Self-pay

## 2019-05-26 DIAGNOSIS — Z1231 Encounter for screening mammogram for malignant neoplasm of breast: Secondary | ICD-10-CM | POA: Insufficient documentation

## 2019-06-01 ENCOUNTER — Ambulatory Visit (INDEPENDENT_AMBULATORY_CARE_PROVIDER_SITE_OTHER): Payer: Medicare PPO | Admitting: Urology

## 2019-06-01 ENCOUNTER — Other Ambulatory Visit: Payer: Self-pay

## 2019-06-01 ENCOUNTER — Encounter: Payer: Self-pay | Admitting: Urology

## 2019-06-01 VITALS — BP 172/83 | HR 84 | Ht 68.0 in | Wt 206.0 lb

## 2019-06-01 DIAGNOSIS — C679 Malignant neoplasm of bladder, unspecified: Secondary | ICD-10-CM

## 2019-06-01 LAB — MICROSCOPIC EXAMINATION
Bacteria, UA: NONE SEEN
RBC, Urine: NONE SEEN /hpf (ref 0–2)
WBC, UA: NONE SEEN /hpf (ref 0–5)

## 2019-06-01 LAB — URINALYSIS, COMPLETE
Bilirubin, UA: NEGATIVE
Glucose, UA: NEGATIVE
Ketones, UA: NEGATIVE
Leukocytes,UA: NEGATIVE
Nitrite, UA: NEGATIVE
Protein,UA: NEGATIVE
RBC, UA: NEGATIVE
Specific Gravity, UA: 1.015 (ref 1.005–1.030)
Urobilinogen, Ur: 0.2 mg/dL (ref 0.2–1.0)
pH, UA: 5.5 (ref 5.0–7.5)

## 2019-06-01 MED ORDER — LIDOCAINE HCL URETHRAL/MUCOSAL 2 % EX GEL
1.0000 "application " | Freq: Once | CUTANEOUS | Status: AC
  Administered 2019-06-01: 1 via URETHRAL

## 2019-06-01 NOTE — Progress Notes (Signed)
   06/01/19  CC:  Chief Complaint  Patient presents with  . Bladder Cancer   Urologic history: 1.Ta urothelial carcinoma the bladder  -TURBT January 2020, low-grade  -Post resection intravesical gemcitabine  HPI: 73 year old female presents for surveillance cystoscopy.  Cystoscopy was slightly delayed secondary to COVID-19 pandemic.  She has no complaints.  Blood pressure (!) 172/83, pulse 84, height 5\' 8"  (1.727 m), weight 206 lb (93.4 kg). NED. A&Ox3.   No respiratory distress   Abd soft, NT, ND Normal external genitalia with patent urethral meatus  Cystoscopy Procedure Note  Patient identification was confirmed, informed consent was obtained, and patient was prepped using Betadine solution.  Lidocaine jelly was administered per urethral meatus.    Procedure: - Flexible cystoscope introduced, without any difficulty.   - Thorough search of the bladder revealed:    normal urethral meatus    normal urothelium    no stones    no ulcers     no tumors    no urethral polyps    no trabeculation  - Ureteral orifices were normal in position and appearance.  Post-Procedure: - Patient tolerated the procedure well  Assessment/ Plan: No evidence of recurrent urothelial carcinoma.  Recommend follow-up surveillance cystoscopy in 6 months.   Abbie Sons, MD

## 2019-06-03 ENCOUNTER — Other Ambulatory Visit: Payer: Medicare PPO | Admitting: Urology

## 2019-06-04 ENCOUNTER — Other Ambulatory Visit: Payer: Medicare PPO | Admitting: Urology

## 2019-07-09 DIAGNOSIS — Z86010 Personal history of colonic polyps: Secondary | ICD-10-CM | POA: Insufficient documentation

## 2019-07-13 ENCOUNTER — Other Ambulatory Visit: Payer: Self-pay | Admitting: Family Medicine

## 2019-07-13 MED ORDER — OXYBUTYNIN CHLORIDE 5 MG PO TABS: ORAL_TABLET | ORAL | 3 refills | Status: AC

## 2019-09-03 ENCOUNTER — Inpatient Hospital Stay: Admission: RE | Admit: 2019-09-03 | Payer: Medicare PPO | Source: Ambulatory Visit

## 2019-11-25 ENCOUNTER — Ambulatory Visit: Payer: Medicare PPO | Admitting: Urology

## 2019-11-25 ENCOUNTER — Other Ambulatory Visit: Payer: Self-pay | Admitting: Radiology

## 2019-11-25 ENCOUNTER — Other Ambulatory Visit: Payer: Self-pay | Admitting: Urology

## 2019-11-25 ENCOUNTER — Encounter: Payer: Self-pay | Admitting: Urology

## 2019-11-25 ENCOUNTER — Other Ambulatory Visit: Payer: Self-pay

## 2019-11-25 VITALS — BP 182/90 | HR 82 | Ht 68.0 in | Wt 204.0 lb

## 2019-11-25 DIAGNOSIS — D494 Neoplasm of unspecified behavior of bladder: Secondary | ICD-10-CM

## 2019-11-25 DIAGNOSIS — C679 Malignant neoplasm of bladder, unspecified: Secondary | ICD-10-CM | POA: Diagnosis not present

## 2019-11-25 MED ORDER — LIDOCAINE HCL URETHRAL/MUCOSAL 2 % EX GEL
1.0000 "application " | Freq: Once | CUTANEOUS | Status: AC
  Administered 2019-11-25: 1 via URETHRAL

## 2019-11-25 MED ORDER — GEMCITABINE CHEMO FOR BLADDER INSTILLATION 2000 MG: 2000.0000 mg | Freq: Once | INTRAVENOUS | Status: DC

## 2019-11-25 NOTE — Progress Notes (Signed)
   11/25/19  CC:  Chief Complaint  Patient presents with  . Cysto    Urologic history: 1. Ta urothelial carcinoma bladder; low-grade  -TURBT 12/2018  -Post resection intravesical gemcitabine  HPI: Ms. Dickert presents for surveillance cystoscopy.  She has no complaints.  Blood pressure (!) 182/90, pulse 82, height 5\' 8"  (1.727 m), weight 204 lb (92.5 kg). NED. A&Ox3.   No respiratory distress   Abd soft, NT, ND Normal external genitalia with patent urethral meatus  Cystoscopy Procedure Note  Patient identification was confirmed, informed consent was obtained, and patient was prepped using Betadine solution.  Lidocaine jelly was administered per urethral meatus.    Procedure: - Flexible cystoscope introduced, without any difficulty.   - Thorough search of the bladder revealed:    normal urethral meatus    2 papillary tumors upper posterior wall/dome    mucosal erythema infero-lateral to tumors    no stones    no ulcers     no urethral polyps    no trabeculation  - Ureteral orifices were normal in position and appearance.  Post-Procedure: - Patient tolerated the procedure well  Assessment/ Plan: -Recurrent bladder tumor -Findings were discussed in detail and recommended scheduling TURBT   Abbie Sons, MD

## 2019-11-25 NOTE — Progress Notes (Signed)
11/25/2019 12:44 PM   Basilia Jumbo MILDERD OZANICH 1946-06-18 [id number]  Referring provider: Marinda Elk, MD [facility],  [place] [address]  Chief Complaint  Patient presents with  . Cysto    HPI: 73 y.o. female with a history of low-grade urothelial carcinoma bladder status post TURBT January 2020 with post resection gemcitabine.  Surveillance cystoscopy performed today remarkable for recurrent tumors upper posterior wall/dome and mucosal erythema.  She presents for TURBT/bladder biopsy.   PMH: Past Medical History:  Diagnosis Date  . Anemia   . Bladder cancer ([place])   . Cirrhosis ([place])    pt denies  . GERD (gastroesophageal reflux disease)   . Hyperlipemia   . Hypertension   . Pre-diabetes   . Psoriasis     Surgical History: Past Surgical History:  Procedure Laterality Date  . ABDOMINAL HYSTERECTOMY    . COLONOSCOPY    . TRANSURETHRAL RESECTION OF BLADDER TUMOR WITH MITOMYCIN-C N/A 01/05/2019   Procedure: TRANSURETHRAL RESECTION OF BLADDER TUMOR WITH Gemcitabine;  Surgeon: Abbie Sons, MD;  Location: [facility] ORS;  Service: Urology;  Laterality: N/A;    Home Medications:  Allergies as of 11/25/2019   No Known Allergies     Medication List       Accurate as of November 25, 2019 12:44 PM. If you have any questions, ask your nurse or doctor.        Adalimumab 40 MG/0.8ML Pnkt Inject the contents of 1 pen (40mg ) under the skin every other week beginning 1 week after initial dose.   amLODipine 10 MG tablet Commonly known as: NORVASC Take 10 mg by mouth daily.   aspirin EC 81 MG tablet Take 81 mg by mouth daily.   atorvastatin 40 MG tablet Commonly known as: LIPITOR Take 40 mg by mouth at bedtime.   clobetasol 0.05 % external solution Commonly known as: TEMOVATE Apply topically to scalp twice daily as needed for psoriasis.   clotrimazole-betamethasone cream Commonly known as: LOTRISONE APPLY TO AFFECTED AREA TWICE A  DAY   D2000 Ultra Strength 50 MCG (2000 UT) Caps Generic drug: Cholecalciferol Take by mouth.   ferrous sulfate 325 (65 FE) MG tablet Take 325 mg by mouth daily.   hydrochlorothiazide 25 MG tablet Commonly known as: HYDRODIURIL Take 25 mg by mouth daily.   meloxicam 15 MG tablet Commonly known as: MOBIC Take 15 mg by mouth daily.   omeprazole 20 MG capsule Commonly known as: PRILOSEC Take 20 mg by mouth daily.   oxybutynin 5 MG tablet Commonly known as: DITROPAN 1 tab tid prn frequency,urgency, bladder spasm   SIMILASAN DRY EYE RELIEF OP Place 2 drops into both eyes daily as needed (for dry eyes).   telmisartan 40 MG tablet Commonly known as: MICARDIS Take by mouth.   traZODone 50 MG tablet Commonly known as: DESYREL TK 1/2 TO 1 T PO HS       Allergies: No Known Allergies  Family History: Family History  Problem Relation Age of Onset  . Breast cancer Neg Hx     Social History:  reports that she has never smoked. She has never used smokeless tobacco. She reports that she does not drink alcohol or use drugs.  ROS: No change from 01/05/2019  Physical Exam: BP (!) 182/90 (BP Location: Left Arm, Patient Position: Sitting, Cuff Size: Large)   Pulse 82   Ht 5\' 8"  (1.727 m)   Wt 204 lb (92.5 kg)   BMI 31.02 kg/m  Constitutional:  Alert and oriented, No acute distress. HEENT: [place] AT, moist mucus membranes.  Trachea midline, no masses. Cardiovascular: No clubbing, cyanosis, or edema.  RRR Respiratory: Normal respiratory effort, no increased work of breathing.  Clear GI: Abdomen is soft, nontender, nondistended, no abdominal masses GU: No CVA tenderness Lymph: No cervical or inguinal lymphadenopathy. Skin: No rashes, bruises or suspicious lesions. Neurologic: Grossly intact, no focal deficits, moving all 4 extremities. Psychiatric: Normal mood and affect.  Assessment & Plan:    - Urothelial carcinoma of bladder  Recurrent bladder tumor endoscopically  consistent with urothelial carcinoma.  Findings were discussed in detail with Ms. Schlaefer.    The indications and nature of the planned procedure were discussed as well as the potential benefits and expected outcome.  Alternatives have been discussed.  Potential complications were discussed including but not limited to bleeding, infection and bladder perforation. The postoperative care and followup was reviewed.  The patient was informed that she may need additional treatment along with periodic surveillance cystoscopy.  All of her questions were answered and she desires to proceed.   Abbie Sons, [place] [address] [place], [place] [address] [phone]

## 2019-11-25 NOTE — H&P (View-Only) (Signed)
11/25/2019 12:44 PM   Marie Bates 10-06-1946 [id number]  Referring provider: Marinda Elk, MD [facility],  [place] [address]  Chief Complaint  Patient presents with  . Cysto    HPI: 73 y.o. female with a history of low-grade urothelial carcinoma bladder status post TURBT January 2020 with post resection gemcitabine.  Surveillance cystoscopy performed today remarkable for recurrent tumors upper posterior wall/dome and mucosal erythema.  She presents for TURBT/bladder biopsy.   PMH: Past Medical History:  Diagnosis Date  . Anemia   . Bladder cancer ([place])   . Cirrhosis ([place])    pt denies  . GERD (gastroesophageal reflux disease)   . Hyperlipemia   . Hypertension   . Pre-diabetes   . Psoriasis     Surgical History: Past Surgical History:  Procedure Laterality Date  . ABDOMINAL HYSTERECTOMY    . COLONOSCOPY    . TRANSURETHRAL RESECTION OF BLADDER TUMOR WITH MITOMYCIN-C N/A 01/05/2019   Procedure: TRANSURETHRAL RESECTION OF BLADDER TUMOR WITH Gemcitabine;  Surgeon: Abbie Sons, MD;  Location: [facility] ORS;  Service: Urology;  Laterality: N/A;    Home Medications:  Allergies as of 11/25/2019   No Known Allergies     Medication List       Accurate as of November 25, 2019 12:44 PM. If you have any questions, ask your nurse or doctor.        Adalimumab 40 MG/0.8ML Pnkt Inject the contents of 1 pen (40mg ) under the skin every other week beginning 1 week after initial dose.   amLODipine 10 MG tablet Commonly known as: NORVASC Take 10 mg by mouth daily.   aspirin EC 81 MG tablet Take 81 mg by mouth daily.   atorvastatin 40 MG tablet Commonly known as: LIPITOR Take 40 mg by mouth at bedtime.   clobetasol 0.05 % external solution Commonly known as: TEMOVATE Apply topically to scalp twice daily as needed for psoriasis.   clotrimazole-betamethasone cream Commonly known as: LOTRISONE APPLY TO AFFECTED AREA TWICE A  DAY   D2000 Ultra Strength 50 MCG (2000 UT) Caps Generic drug: Cholecalciferol Take by mouth.   ferrous sulfate 325 (65 FE) MG tablet Take 325 mg by mouth daily.   hydrochlorothiazide 25 MG tablet Commonly known as: HYDRODIURIL Take 25 mg by mouth daily.   meloxicam 15 MG tablet Commonly known as: MOBIC Take 15 mg by mouth daily.   omeprazole 20 MG capsule Commonly known as: PRILOSEC Take 20 mg by mouth daily.   oxybutynin 5 MG tablet Commonly known as: DITROPAN 1 tab tid prn frequency,urgency, bladder spasm   SIMILASAN DRY EYE RELIEF OP Place 2 drops into both eyes daily as needed (for dry eyes).   telmisartan 40 MG tablet Commonly known as: MICARDIS Take by mouth.   traZODone 50 MG tablet Commonly known as: DESYREL TK 1/2 TO 1 T PO HS       Allergies: No Known Allergies  Family History: Family History  Problem Relation Age of Onset  . Breast cancer Neg Hx     Social History:  reports that she has never smoked. She has never used smokeless tobacco. She reports that she does not drink alcohol or use drugs.  ROS: No change from 01/05/2019  Physical Exam: BP (!) 182/90 (BP Location: Left Arm, Patient Position: Sitting, Cuff Size: Large)   Pulse 82   Ht 5\' 8"  (1.727 m)   Wt 204 lb (92.5 kg)   BMI 31.02 kg/m  Constitutional:  Alert and oriented, No acute distress. HEENT: [place] AT, moist mucus membranes.  Trachea midline, no masses. Cardiovascular: No clubbing, cyanosis, or edema.  RRR Respiratory: Normal respiratory effort, no increased work of breathing.  Clear GI: Abdomen is soft, nontender, nondistended, no abdominal masses GU: No CVA tenderness Lymph: No cervical or inguinal lymphadenopathy. Skin: No rashes, bruises or suspicious lesions. Neurologic: Grossly intact, no focal deficits, moving all 4 extremities. Psychiatric: Normal mood and affect.  Assessment & Plan:    - Urothelial carcinoma of bladder  Recurrent bladder tumor endoscopically  consistent with urothelial carcinoma.  Findings were discussed in detail with [name].    The indications and nature of the planned procedure were discussed as well as the potential benefits and expected outcome.  Alternatives have been discussed.  Potential complications were discussed including but not limited to bleeding, infection and bladder perforation. The postoperative care and followup was reviewed.  The patient was informed that she may need additional treatment along with periodic surveillance cystoscopy.  All of her questions were answered and she desires to proceed.   Abbie Sons, [place] [facility] [address] [place], [place] [address] [phone]

## 2019-11-26 ENCOUNTER — Other Ambulatory Visit
Admission: RE | Admit: 2019-11-26 | Discharge: 2019-11-26 | Disposition: A | Discharge status: Home / Self Care | Payer: Medicare PPO | Source: Ambulatory Visit | Attending: Internal Medicine | Admitting: Internal Medicine

## 2019-11-26 DIAGNOSIS — Z20828 Contact with and (suspected) exposure to other viral communicable diseases: Secondary | ICD-10-CM | POA: Insufficient documentation

## 2019-11-26 DIAGNOSIS — Z01812 Encounter for preprocedural laboratory examination: Secondary | ICD-10-CM | POA: Diagnosis present

## 2019-11-26 LAB — SARS CORONAVIRUS 2 (TAT 6-24 HRS): SARS Coronavirus 2: NEGATIVE

## 2019-12-01 ENCOUNTER — Ambulatory Visit: Payer: Medicare PPO | Admitting: Anesthesiology

## 2019-12-01 ENCOUNTER — Encounter
Admission: RE | Disposition: A | Discharge status: Home / Self Care | Payer: Self-pay | Source: Home / Self Care | Attending: Internal Medicine

## 2019-12-01 ENCOUNTER — Encounter: Payer: Self-pay | Admitting: *Deleted

## 2019-12-01 ENCOUNTER — Ambulatory Visit
Admission: RE | Admit: 2019-12-01 | Discharge: 2019-12-01 | Disposition: A | Discharge status: Home / Self Care | Payer: Medicare PPO | Attending: Internal Medicine | Admitting: Internal Medicine

## 2019-12-01 DIAGNOSIS — K219 Gastro-esophageal reflux disease without esophagitis: Secondary | ICD-10-CM | POA: Insufficient documentation

## 2019-12-01 DIAGNOSIS — Z791 Long term (current) use of non-steroidal anti-inflammatories (NSAID): Secondary | ICD-10-CM | POA: Insufficient documentation

## 2019-12-01 DIAGNOSIS — Z86010 Personal history of colonic polyps: Secondary | ICD-10-CM | POA: Insufficient documentation

## 2019-12-01 DIAGNOSIS — K7460 Unspecified cirrhosis of liver: Secondary | ICD-10-CM | POA: Diagnosis not present

## 2019-12-01 DIAGNOSIS — Z800 Family history of malignant neoplasm of digestive organs: Secondary | ICD-10-CM | POA: Diagnosis not present

## 2019-12-01 DIAGNOSIS — Z8551 Personal history of malignant neoplasm of bladder: Secondary | ICD-10-CM | POA: Insufficient documentation

## 2019-12-01 DIAGNOSIS — K5730 Diverticulosis of large intestine without perforation or abscess without bleeding: Secondary | ICD-10-CM | POA: Insufficient documentation

## 2019-12-01 DIAGNOSIS — Z7982 Long term (current) use of aspirin: Secondary | ICD-10-CM | POA: Insufficient documentation

## 2019-12-01 DIAGNOSIS — R7303 Prediabetes: Secondary | ICD-10-CM | POA: Diagnosis not present

## 2019-12-01 DIAGNOSIS — Z79899 Other long term (current) drug therapy: Secondary | ICD-10-CM | POA: Insufficient documentation

## 2019-12-01 DIAGNOSIS — Z8371 Family history of colonic polyps: Secondary | ICD-10-CM | POA: Insufficient documentation

## 2019-12-01 DIAGNOSIS — E785 Hyperlipidemia, unspecified: Secondary | ICD-10-CM | POA: Insufficient documentation

## 2019-12-01 DIAGNOSIS — L409 Psoriasis, unspecified: Secondary | ICD-10-CM | POA: Diagnosis not present

## 2019-12-01 DIAGNOSIS — D649 Anemia, unspecified: Secondary | ICD-10-CM | POA: Insufficient documentation

## 2019-12-01 DIAGNOSIS — Z1211 Encounter for screening for malignant neoplasm of colon: Secondary | ICD-10-CM | POA: Diagnosis not present

## 2019-12-01 DIAGNOSIS — I10 Essential (primary) hypertension: Secondary | ICD-10-CM | POA: Diagnosis not present

## 2019-12-01 DIAGNOSIS — K640 First degree hemorrhoids: Secondary | ICD-10-CM | POA: Insufficient documentation

## 2019-12-01 HISTORY — PX: COLONOSCOPY WITH PROPOFOL: SHX5780

## 2019-12-01 SURGERY — COLONOSCOPY WITH PROPOFOL
Anesthesia: General

## 2019-12-01 MED ORDER — SODIUM CHLORIDE 0.9 % IV SOLN
INTRAVENOUS | Status: DC
  Administered 2019-12-01: 10:00:00 via INTRAVENOUS

## 2019-12-01 MED ORDER — LIDOCAINE HCL (CARDIAC) PF 100 MG/5ML IV SOSY
PREFILLED_SYRINGE | INTRAVENOUS | Status: DC | PRN
  Administered 2019-12-01: 50 mg via INTRAVENOUS

## 2019-12-01 MED ORDER — PROPOFOL 10 MG/ML IV BOLUS
INTRAVENOUS | Status: DC | PRN
  Administered 2019-12-01: 90 mg via INTRAVENOUS

## 2019-12-01 MED ORDER — PROPOFOL 10 MG/ML IV BOLUS
INTRAVENOUS | Status: AC
  Filled 2019-12-01: qty 20

## 2019-12-01 MED ORDER — PROPOFOL 500 MG/50ML IV EMUL
INTRAVENOUS | Status: DC | PRN
  Administered 2019-12-01: 130 ug/kg/min via INTRAVENOUS

## 2019-12-01 NOTE — Interval H&P Note (Signed)
History and Physical Interval Note:  12/01/2019 11:53 AM  Marie Bates  has presented today for surgery, with the diagnosis of CHRONIC GASTRITIS,PERSONAL HX.OF COLON POLYPS.  The various methods of treatment have been discussed with the patient and family. After consideration of risks, benefits and other options for treatment, the patient has consented to  Procedure(s): COLONOSCOPY WITH PROPOFOL (N/A) as a surgical intervention.  The patient's history has been reviewed, patient examined, no change in status, stable for surgery.  I have reviewed the patient's chart and labs.  Questions were answered to the patient's satisfaction.     [place], [place]

## 2019-12-01 NOTE — H&P (Signed)
Outpatient short stay form Pre-procedure 12/01/2019 10:38 AM Marie Bates K. Alice Reichert, M.D.  Primary Physician: Paulita Cradle, PA-C  Reason for visit:  Family history of colon polyps in a first degree relative.  History of present illness:   73 year old patient presenting for family history of colon cancer. Patient denies any change in bowel habits, rectal bleeding or involuntary weight loss.     Current Facility-Administered Medications:  .  0.9 %  sodium chloride infusion, , Intravenous, Continuous, [place], Benay Pike, MD, Last Rate: 20 mL/hr at 12/01/19 1023  Facility-Administered Medications Prior to Admission  Medication Dose Route Frequency Provider Last Rate Last Dose  . gemcitabine (GEMZAR) chemo syringe for bladder instillation 2,000 mg  2,000 mg Bladder Instillation Once Stoioff, Scott C, MD      . gemcitabine (GEMZAR) chemo syringe for bladder instillation 2,000 mg  2,000 mg Bladder Instillation Once Stoioff, Ronda Fairly, MD       Medications Prior to Admission  Medication Sig Dispense Refill Last Dose  . amLODipine (NORVASC) 10 MG tablet Take 10 mg by mouth daily.    12/01/2019 at 0730  . Apremilast 30 MG TABS Take by mouth 2 (two) times daily. OTEZLA     . aspirin EC 81 MG tablet Take 81 mg by mouth daily.    Past Month at Unknown time  . atorvastatin (LIPITOR) 40 MG tablet Take 40 mg by mouth at bedtime.    11/30/2019 at Unknown time  . Cholecalciferol 50 MCG (2000 UT) CAPS Take 2,000 Units by mouth daily.   Past Month at Unknown time  . clobetasol (TEMOVATE) 0.05 % external solution Apply 1 application topically 2 (two) times daily as needed (psoriasis).    Past Month at Unknown time  . ferrous sulfate 325 (65 FE) MG tablet Take 325 mg by mouth daily.    Past Month at Unknown time  . hydrochlorothiazide (HYDRODIURIL) 25 MG tablet Take 25 mg by mouth daily.    Past Week at Unknown time  . meloxicam (MOBIC) 15 MG tablet Take 15 mg by mouth daily.  0 Past Week at Unknown time  .  omeprazole (PRILOSEC) 20 MG capsule Take 20 mg by mouth daily as needed (acid reflux).    Past Month at Unknown time  . oxybutynin (DITROPAN) 5 MG tablet 1 tab tid prn frequency,urgency, bladder spasm (Patient taking differently: Take 5 mg by mouth 3 (three) times daily as needed for bladder spasms. ) 270 tablet 3 11/30/2019 at Unknown time  . telmisartan (MICARDIS) 40 MG tablet Take 40 mg by mouth daily.    12/01/2019 at 0730  . traZODone (DESYREL) 50 MG tablet Take 50 mg by mouth at bedtime as needed for sleep.    Past Week at Unknown time  . clotrimazole-betamethasone (LOTRISONE) cream Apply 1 application topically 2 (two) times daily.    Not Taking at Unknown time  . Homeopathic Products (SIMILASAN DRY EYE RELIEF OP) Place 2 drops into both eyes daily as needed (for dry eyes).        No Known Allergies   Past Medical History:  Diagnosis Date  . Anemia   . Bladder cancer ([place])   . Cirrhosis ([place])    pt denies  . GERD (gastroesophageal reflux disease)   . Hyperlipemia   . Hypertension   . Pre-diabetes   . Psoriasis     Review of systems:  Otherwise negative.    Physical Exam  Gen: Alert, oriented. Appears stated age.  HEENT: [place]/AT. PERRLA.  Lungs: CTA, no wheezes. CV: RR nl S1, S2. Abd: soft, benign, no masses. BS+ Ext: No edema. Pulses 2+    Planned procedures: Proceed with colonoscopy. The patient understands the nature of the planned procedure, indications, risks, alternatives and potential complications including but not limited to bleeding, infection, perforation, damage to internal organs and possible oversedation/side effects from anesthesia. The patient agrees and gives consent to proceed.  Please refer to procedure notes for findings, recommendations and patient disposition/instructions.     Arash Karstens K. Alice Reichert, M.D. Gastroenterology 12/01/2019  10:38 AM

## 2019-12-01 NOTE — Interval H&P Note (Signed)
History and Physical Interval Note:  12/01/2019 10:39 AM  Marie Bates  has presented today for surgery, with the diagnosis of CHRONIC GASTRITIS,PERSONAL HX.OF COLON POLYPS.  The various methods of treatment have been discussed with the patient and family. After consideration of risks, benefits and other options for treatment, the patient has consented to  Procedure(s): COLONOSCOPY WITH PROPOFOL (N/A) as a surgical intervention.  The patient's history has been reviewed, patient examined, no change in status, stable for surgery.  I have reviewed the patient's chart and labs.  Questions were answered to the patient's satisfaction.     [place], [place]

## 2019-12-01 NOTE — Op Note (Signed)
[facility]astroenter[facility] Patient Name: Marie Bates Procedure Date: 12/01/2019 11:46 AM MRN: [id number] Account #: [id number] Date of Birth: 13-Nov-1946 Admit Type: Outpatient Age: 73 Room: [facility] ENDO ROOM 3 Gender: Female Note Status: Finalized Procedure:             Colonoscopy Indications:           High risk colon cancer surveillance: Personal history                         of colonic polyps Providers:             Benay Pike. Alice Reichert MD, MD Referring MD:          Precious Bard, MD (Referring MD) Medicines:             Propofol per Anesthesia Complications:         No immediate complications. Procedure:             Pre-Anesthesia Assessment:                        - The risks and benefits of the procedure and the                         sedation options and risks were discussed with the                         patient. All questions were answered and informed                         consent was obtained.                        - Patient identification and proposed procedure were                         verified prior to the procedure by the nurse. The                         procedure was verified in the procedure room.                        - ASA Grade Assessment: III - A patient with severe                         systemic disease.                        - After reviewing the risks and benefits, the patient                         was deemed in satisfactory condition to undergo the                         procedure.                        After obtaining informed consent, the colonoscope was                         passed under direct vision. Throughout the  procedure,                         the patient's blood pressure, pulse, and oxygen                         saturations were monitored continuously. The                         Colonoscope was introduced through the anus and                         advanced to the the cecum, identified by  appendiceal                         orifice and ileocecal valve. The colonoscopy was                         somewhat difficult due to significant looping.                         Successful completion of the procedure was aided by                         applying abdominal pressure. The patient tolerated the                         procedure well. The quality of the bowel preparation                         was adequate. The ileocecal valve, appendiceal                         orifice, and rectum were photographed. Findings:      The perianal and digital rectal examinations were normal. Pertinent       negatives include normal sphincter tone and no palpable rectal lesions.      A few medium-mouthed diverticula were found in the sigmoid colon.      Internal hemorrhoids were found during retroflexion. The hemorrhoids       were Grade I (internal hemorrhoids that do not prolapse).      The exam was otherwise without abnormality. Impression:            - Diverticulosis in the sigmoid colon.                        - Internal hemorrhoids.                        - The examination was otherwise normal.                        - No specimens collected. Recommendation:        - Patient has a contact number available for                         emergencies. The signs and symptoms of potential                         delayed complications were discussed  with the patient.                         Return to normal activities tomorrow. Written                         discharge instructions were provided to the patient.                        - Resume previous diet.                        - Continue present medications.                        - No repeat colonoscopy due to age and the absence of                         colonic polyps.                        - Return to GI office PRN.                        - The findings and recommendations were discussed with                         the  patient. Procedure Code(s):     --- Professional ---                        [id number], Colorectal cancer screening; colonoscopy on                         individual at high risk Diagnosis Code(s):     --- Professional ---                        K57.30, Diverticulosis of large intestine without                         perforation or abscess without bleeding                        K64.0, First degree hemorrhoids                        Z86.010, Personal history of colonic polyps CPT copyright 2019 [employer]. All rights reserved. The codes documented in this report are preliminary and upon coder review may  be revised to meet current compliance requirements. Efrain Sella MD, MD 12/01/2019 12:25:23 PM This report has been signed electronically. Number of Addenda: 0 Note Initiated On: 12/01/2019 11:46 AM Scope Withdrawal Time: 0 hours 6 minutes 8 seconds  Total Procedure Duration: 0 hours 13 minutes 10 seconds  Estimated Blood Loss:  Estimated blood loss: none.      [facility]

## 2019-12-01 NOTE — Anesthesia Preprocedure Evaluation (Signed)
Anesthesia Evaluation  Patient identified by MRN, date of birth, ID band Patient awake    Reviewed: Allergy & Precautions, NPO status , Patient's Chart, lab work & pertinent test results  History of Anesthesia Complications Negative for: history of anesthetic complications  Airway Mallampati: III       Dental   Pulmonary neg sleep apnea, neg COPD, Not current smoker,           Cardiovascular hypertension, Pt. on medications (-) Past MI and (-) CHF (-) dysrhythmias (-) Valvular Problems/Murmurs     Neuro/Psych neg Seizures    GI/Hepatic Neg liver ROS, GERD  Medicated and Controlled,  Endo/Other  neg diabetes  Renal/GU negative Renal ROS     Musculoskeletal   Abdominal   Peds  Hematology  (+) anemia ,   Anesthesia Other Findings   Reproductive/Obstetrics                             Anesthesia Physical Anesthesia Plan  ASA: II  Anesthesia Plan: General   Post-op Pain Management:    Induction: Intravenous  PONV Risk Score and Plan: 3 and TIVA, Propofol infusion and Treatment may vary due to age or medical condition  Airway Management Planned: Nasal Cannula  Additional Equipment:   Intra-op Plan:   Post-operative Plan:   Informed Consent: I have reviewed the patients History and Physical, chart, labs and discussed the procedure including the risks, benefits and alternatives for the proposed anesthesia with the patient or authorized representative who has indicated his/her understanding and acceptance.       Plan Discussed with:   Anesthesia Plan Comments:         Anesthesia Quick Evaluation

## 2019-12-01 NOTE — Anesthesia Post-op Follow-up Note (Signed)
Anesthesia QCDR form completed.        

## 2019-12-01 NOTE — Transfer of Care (Signed)
Immediate Anesthesia Transfer of Care Note  Patient: Marie Bates  Procedure(s) Performed: COLONOSCOPY WITH PROPOFOL (N/A )  Patient Location: Endoscopy Unit  Anesthesia Type:General  Level of Consciousness: drowsy and patient cooperative  Airway & Oxygen Therapy: Patient Spontanous Breathing  Post-op Assessment: Report given to RN and Post -op Vital signs reviewed and stable  Post vital signs: Reviewed and stable  Last Vitals:  Vitals Value Taken Time  BP 110/75 12/01/19 1227  Temp 36.2 C 12/01/19 1225  Pulse 64 12/01/19 1227  Resp 17 12/01/19 1227  SpO2 99 % 12/01/19 1227  Vitals shown include unvalidated device data.  Last Pain:  Vitals:   12/01/19 1225  TempSrc: Temporal  PainSc: Asleep         Complications: No apparent anesthesia complications

## 2019-12-02 ENCOUNTER — Encounter: Payer: Self-pay | Admitting: *Deleted

## 2019-12-03 ENCOUNTER — Other Ambulatory Visit: Payer: Self-pay

## 2019-12-03 ENCOUNTER — Encounter
Admission: RE | Admit: 2019-12-03 | Discharge: 2019-12-03 | Disposition: A | Discharge status: Home / Self Care | Payer: Medicare PPO | Source: Ambulatory Visit | Attending: Urology | Admitting: Urology

## 2019-12-03 ENCOUNTER — Other Ambulatory Visit: Payer: Self-pay | Admitting: Radiology

## 2019-12-03 DIAGNOSIS — E876 Hypokalemia: Secondary | ICD-10-CM

## 2019-12-03 DIAGNOSIS — Z20828 Contact with and (suspected) exposure to other viral communicable diseases: Secondary | ICD-10-CM | POA: Diagnosis not present

## 2019-12-03 DIAGNOSIS — I491 Atrial premature depolarization: Secondary | ICD-10-CM | POA: Diagnosis not present

## 2019-12-03 DIAGNOSIS — C679 Malignant neoplasm of bladder, unspecified: Secondary | ICD-10-CM | POA: Insufficient documentation

## 2019-12-03 DIAGNOSIS — I10 Essential (primary) hypertension: Secondary | ICD-10-CM | POA: Diagnosis not present

## 2019-12-03 DIAGNOSIS — R008 Other abnormalities of heart beat: Secondary | ICD-10-CM | POA: Diagnosis not present

## 2019-12-03 DIAGNOSIS — Z01818 Encounter for other preprocedural examination: Secondary | ICD-10-CM | POA: Diagnosis not present

## 2019-12-03 LAB — PROTIME-INR
INR: 1 (ref 0.8–1.2)
Prothrombin Time: 13 seconds (ref 11.4–15.2)

## 2019-12-03 LAB — CBC
HCT: 34.7 % — ABNORMAL LOW (ref 36.0–46.0)
Hemoglobin: 11.8 g/dL — ABNORMAL LOW (ref 12.0–15.0)
MCH: 26.5 pg (ref 26.0–34.0)
MCHC: 34 g/dL (ref 30.0–36.0)
MCV: 78 fL — ABNORMAL LOW (ref 80.0–100.0)
Platelets: 306 10*3/uL (ref 150–400)
RBC: 4.45 MIL/uL (ref 3.87–5.11)
RDW: 14.1 % (ref 11.5–15.5)
WBC: 5.5 10*3/uL (ref 4.0–10.5)
nRBC: 0 % (ref 0.0–0.2)

## 2019-12-03 LAB — SARS CORONAVIRUS 2 (TAT 6-24 HRS): SARS Coronavirus 2: NEGATIVE

## 2019-12-03 LAB — BASIC METABOLIC PANEL
Anion gap: 9 (ref 5–15)
BUN: 15 mg/dL (ref 8–23)
CO2: 28 mmol/L (ref 22–32)
Calcium: 9.8 mg/dL (ref 8.9–10.3)
Chloride: 103 mmol/L (ref 98–111)
Creatinine, Ser: 0.72 mg/dL (ref 0.44–1.00)
GFR calc Af Amer: >60 mL/min (ref >60)
GFR calc non Af Amer: >60 mL/min (ref >60)
Glucose, Bld: 79 mg/dL (ref 70–99)
Potassium: 3.3 mmol/L — ABNORMAL LOW (ref 3.5–5.1)
Sodium: 140 mmol/L (ref 135–145)

## 2019-12-03 MED ORDER — POTASSIUM CHLORIDE ER 10 MEQ PO TBCR: 10.0000 meq | EXTENDED_RELEASE_TABLET | Freq: Every day | ORAL | 0 refills | Status: AC

## 2019-12-03 NOTE — Patient Instructions (Signed)
Your procedure is scheduled on: 12/07/19 Report to [facility]. To find out your arrival time please call [phone] between 1PM - 3PM on 12/06/19 .  Remember: Instructions that are not followed completely may result in serious medical risk, up to and including death, or upon the discretion of your surgeon and anesthesiologist your surgery may need to be rescheduled.     _X__ 1. Do not eat food after midnight the night before your procedure.                 No gum chewing or hard candies. You may drink clear liquids up to 2 hours                 before you are scheduled to arrive for your surgery- DO not drink clear                 liquids within 2 hours of the start of your surgery.                 Clear Liquids include:  water, apple juice without pulp, clear carbohydrate                 drink such as Clearfast or Gatorade, Black Coffee or Tea (Do not add                 anything to coffee or tea). Diabetics water only  __X__2.  On the morning of surgery brush your teeth with toothpaste and water, you                 may rinse your mouth with mouthwash if you wish.  Do not swallow any              toothpaste of mouthwash.     _X__ 3.  No Alcohol for 24 hours before or after surgery.   _X__ 4.  Do Not Smoke or use e-cigarettes For 24 Hours Prior to Your Surgery.                 Do not use any chewable tobacco products for at least 6 hours prior to                 surgery.  ____  5.  Bring all medications with you on the day of surgery if instructed.   __X__  6.  Notify your doctor if there is any change in your medical condition      (cold, fever, infections).     Do not wear jewelry, make-up, hairpins, clips or nail polish. Do not wear lotions, powders, or perfumes.  Do not shave 48 hours prior to surgery. Men may shave face and neck. Do not bring valuables to the hospital.    [facility] is not responsible for any belongings  or valuables.  Contacts, dentures/partials or body piercings may not be worn into surgery. Bring a case for your contacts, glasses or hearing aids, a denture cup will be supplied. Leave your suitcase in the car. After surgery it may be brought to your room. For patients admitted to the hospital, discharge time is determined by your treatment team.   Patients discharged the day of surgery will not be allowed to drive home.   Please read over the following fact sheets that you were given:   MRSA Information  __X__ Take these medicines the morning of surgery with A SIP OF WATER:  1. amLODipine (NORVASC) 10 MG tablet  2. omeprazole (PRILOSEC) 20 MG capsule  3. oxybutynin (DITROPAN) 5 MG tablet  4.  5.  6.  ____ Fleet Enema (as directed)   ____ Use CHG Soap/SAGE wipes as directed  ____ Use inhalers on the day of surgery  ____ Stop metformin/Janumet/Farxiga 2 days prior to surgery    ____ Take 1/2 of usual insulin dose the night before surgery. No insulin the morning          of surgery.   ____ Stop Blood Thinners Coumadin/Plavix/Xarelto/Pleta/Pradaxa/Eliquis/Effient/Aspirin  on   Or contact your Surgeon, Cardiologist or Medical Doctor regarding  ability to stop your blood thinners  __X__ Stop Anti-inflammatories 7 days before surgery such as Advil, Ibuprofen, Motrin,  BC or Goodies Powder, Naprosyn, Naproxen, Aleve, Aspirin    __X__ Stop all herbal supplements, fish oil or vitamin E until after surgery.    ____ Bring C-Pap to the hospital.

## 2019-12-04 LAB — URINE CULTURE: Culture: <10000 — AB

## 2019-12-07 ENCOUNTER — Encounter: Payer: Self-pay | Admitting: Urology

## 2019-12-07 ENCOUNTER — Ambulatory Visit: Payer: Medicare PPO | Admitting: Anesthesiology

## 2019-12-07 ENCOUNTER — Ambulatory Visit
Admission: RE | Admit: 2019-12-07 | Discharge: 2019-12-07 | Disposition: A | Discharge status: Home / Self Care | Payer: Medicare PPO | Attending: Urology | Admitting: Urology

## 2019-12-07 ENCOUNTER — Encounter
Admission: RE | Disposition: A | Discharge status: Home / Self Care | Payer: Self-pay | Source: Home / Self Care | Attending: Urology

## 2019-12-07 ENCOUNTER — Other Ambulatory Visit: Payer: Self-pay

## 2019-12-07 DIAGNOSIS — N3289 Other specified disorders of bladder: Secondary | ICD-10-CM | POA: Insufficient documentation

## 2019-12-07 DIAGNOSIS — Z7982 Long term (current) use of aspirin: Secondary | ICD-10-CM | POA: Diagnosis not present

## 2019-12-07 DIAGNOSIS — Z8551 Personal history of malignant neoplasm of bladder: Secondary | ICD-10-CM | POA: Diagnosis not present

## 2019-12-07 DIAGNOSIS — E785 Hyperlipidemia, unspecified: Secondary | ICD-10-CM | POA: Insufficient documentation

## 2019-12-07 DIAGNOSIS — L409 Psoriasis, unspecified: Secondary | ICD-10-CM | POA: Insufficient documentation

## 2019-12-07 DIAGNOSIS — R7303 Prediabetes: Secondary | ICD-10-CM | POA: Diagnosis not present

## 2019-12-07 DIAGNOSIS — N3090 Cystitis, unspecified without hematuria: Secondary | ICD-10-CM | POA: Insufficient documentation

## 2019-12-07 DIAGNOSIS — D649 Anemia, unspecified: Secondary | ICD-10-CM | POA: Diagnosis not present

## 2019-12-07 DIAGNOSIS — I10 Essential (primary) hypertension: Secondary | ICD-10-CM | POA: Insufficient documentation

## 2019-12-07 DIAGNOSIS — D494 Neoplasm of unspecified behavior of bladder: Secondary | ICD-10-CM

## 2019-12-07 DIAGNOSIS — Z79899 Other long term (current) drug therapy: Secondary | ICD-10-CM | POA: Insufficient documentation

## 2019-12-07 DIAGNOSIS — Z791 Long term (current) use of non-steroidal anti-inflammatories (NSAID): Secondary | ICD-10-CM | POA: Diagnosis not present

## 2019-12-07 DIAGNOSIS — K7460 Unspecified cirrhosis of liver: Secondary | ICD-10-CM | POA: Insufficient documentation

## 2019-12-07 DIAGNOSIS — K219 Gastro-esophageal reflux disease without esophagitis: Secondary | ICD-10-CM | POA: Diagnosis not present

## 2019-12-07 HISTORY — PX: TRANSURETHRAL RESECTION OF BLADDER TUMOR: SHX2575

## 2019-12-07 LAB — POCT I-STAT, CHEM 8
BUN: 18 mg/dL (ref 8–23)
Calcium, Ion: 1.18 mmol/L (ref 1.15–1.40)
Chloride: 102 mmol/L (ref 98–111)
Creatinine, Ser: 0.8 mg/dL (ref 0.44–1.00)
Glucose, Bld: 112 mg/dL — ABNORMAL HIGH (ref 70–99)
HCT: 38 % (ref 36.0–46.0)
Hemoglobin: 12.9 g/dL (ref 12.0–15.0)
Potassium: 4 mmol/L (ref 3.5–5.1)
Sodium: 139 mmol/L (ref 135–145)
TCO2: 27 mmol/L (ref 22–32)

## 2019-12-07 SURGERY — TURBT (TRANSURETHRAL RESECTION OF BLADDER TUMOR)
Anesthesia: General | Site: Bladder

## 2019-12-07 MED ORDER — FENTANYL CITRATE (PF) 100 MCG/2ML IJ SOLN
INTRAMUSCULAR | Status: AC
  Filled 2019-12-07: qty 2

## 2019-12-07 MED ORDER — CEFAZOLIN SODIUM-DEXTROSE 2-4 GM/100ML-% IV SOLN
2.0000 g | INTRAVENOUS | Status: AC
  Administered 2019-12-07: 09:00:00 2 g via INTRAVENOUS

## 2019-12-07 MED ORDER — GLYCOPYRROLATE 0.2 MG/ML IJ SOLN
INTRAMUSCULAR | Status: AC
  Filled 2019-12-07: qty 3

## 2019-12-07 MED ORDER — PROPOFOL 10 MG/ML IV BOLUS
INTRAVENOUS | Status: AC
  Filled 2019-12-07: qty 20

## 2019-12-07 MED ORDER — FAMOTIDINE 20 MG PO TABS
ORAL_TABLET | ORAL | Status: AC
  Administered 2019-12-07: 20 mg via ORAL
  Filled 2019-12-07: qty 1

## 2019-12-07 MED ORDER — GLYCOPYRROLATE 0.2 MG/ML IJ SOLN
INTRAMUSCULAR | Status: DC | PRN
  Administered 2019-12-07: .2 mg via INTRAVENOUS

## 2019-12-07 MED ORDER — ROCURONIUM BROMIDE 50 MG/5ML IV SOLN
INTRAVENOUS | Status: AC
  Filled 2019-12-07: qty 5

## 2019-12-07 MED ORDER — SODIUM CHLORIDE 0.9 % IV SOLN
INTRAVENOUS | Status: DC | PRN
  Administered 2019-12-07: 09:00:00 100 ug via INTRAVENOUS

## 2019-12-07 MED ORDER — DEXAMETHASONE SODIUM PHOSPHATE 10 MG/ML IJ SOLN
INTRAMUSCULAR | Status: DC | PRN
  Administered 2019-12-07: 10 mg via INTRAVENOUS

## 2019-12-07 MED ORDER — FENTANYL CITRATE (PF) 100 MCG/2ML IJ SOLN
INTRAMUSCULAR | Status: DC | PRN
  Administered 2019-12-07 (×3): 25 ug via INTRAVENOUS

## 2019-12-07 MED ORDER — LACTATED RINGERS IV SOLN: INTRAVENOUS | Status: DC

## 2019-12-07 MED ORDER — CEFAZOLIN SODIUM-DEXTROSE 2-4 GM/100ML-% IV SOLN
INTRAVENOUS | Status: AC
  Filled 2019-12-07: qty 100

## 2019-12-07 MED ORDER — ONDANSETRON HCL 4 MG/2ML IJ SOLN
INTRAMUSCULAR | Status: DC | PRN
  Administered 2019-12-07: 4 mg via INTRAVENOUS

## 2019-12-07 MED ORDER — MIDAZOLAM HCL 2 MG/2ML IJ SOLN
INTRAMUSCULAR | Status: DC | PRN
  Administered 2019-12-07: 2 mg via INTRAVENOUS

## 2019-12-07 MED ORDER — PROPOFOL 10 MG/ML IV BOLUS
INTRAVENOUS | Status: DC | PRN
  Administered 2019-12-07: 120 mg via INTRAVENOUS

## 2019-12-07 MED ORDER — GEMCITABINE CHEMO FOR BLADDER INSTILLATION 2000 MG
INTRAVENOUS | Status: DC | PRN
  Administered 2019-12-07: 2000 mg via INTRAVESICAL

## 2019-12-07 MED ORDER — LACTATED RINGERS IV SOLN: INTRAVENOUS | Status: DC | PRN

## 2019-12-07 MED ORDER — FAMOTIDINE 20 MG PO TABS: 20.0000 mg | ORAL_TABLET | Freq: Once | ORAL | Status: AC

## 2019-12-07 MED ORDER — LIDOCAINE HCL (PF) 2 % IJ SOLN
INTRAMUSCULAR | Status: AC
  Filled 2019-12-07: qty 10

## 2019-12-07 MED ORDER — MIDAZOLAM HCL 2 MG/2ML IJ SOLN
INTRAMUSCULAR | Status: AC
  Filled 2019-12-07: qty 2

## 2019-12-07 MED ORDER — FENTANYL CITRATE (PF) 100 MCG/2ML IJ SOLN: 25.0000 ug | INTRAMUSCULAR | Status: DC | PRN

## 2019-12-07 MED ORDER — LIDOCAINE HCL (CARDIAC) PF 100 MG/5ML IV SOSY
PREFILLED_SYRINGE | INTRAVENOUS | Status: DC | PRN
  Administered 2019-12-07: 80 mg via INTRAVENOUS

## 2019-12-07 MED ORDER — ONDANSETRON HCL 4 MG/2ML IJ SOLN
INTRAMUSCULAR | Status: AC
  Filled 2019-12-07: qty 12

## 2019-12-07 SURGICAL SUPPLY — 22 items
BAG DRAIN CYSTO-URO LG1000N (MISCELLANEOUS) ×2 IMPLANT
BAG URINE DRAIN 2000ML AR STRL (UROLOGICAL SUPPLIES) ×2 IMPLANT
CATH FOLEY 2WAY 18X30 (CATHETERS) IMPLANT
CATH FOLEY 2WAY SIL 18X30 (CATHETERS)
DRAPE UTILITY 15X26 TOWEL STRL (DRAPES) ×2 IMPLANT
DRSG TELFA 4X3 1S NADH ST (GAUZE/BANDAGES/DRESSINGS) ×2 IMPLANT
ELECT LOOP 22F BIPOLAR SML (ELECTROSURGICAL) ×2
ELECT REM PT RETURN 9FT ADLT (ELECTROSURGICAL)
ELECTRODE LOOP 22F BIPOLAR SML (ELECTROSURGICAL) IMPLANT
ELECTRODE REM PT RTRN 9FT ADLT (ELECTROSURGICAL) IMPLANT
GLOVE BIO SURGEON STRL SZ8 (GLOVE) ×2 IMPLANT
GOWN STRL REUS W/ TWL LRG LVL3 (GOWN DISPOSABLE) ×1 IMPLANT
GOWN STRL REUS W/TWL LRG LVL3 (GOWN DISPOSABLE) ×1
GOWN STRL REUS W/TWL XL LVL4 (GOWN DISPOSABLE) ×2 IMPLANT
IV NS IRRIG 3000ML ARTHROMATIC (IV SOLUTION) ×4 IMPLANT
KIT TURNOVER CYSTO (KITS) ×2 IMPLANT
LOOP CUT BIPOLAR 24F LRG (ELECTROSURGICAL) IMPLANT
PACK CYSTO AR (MISCELLANEOUS) ×2 IMPLANT
SET IRRIG Y TYPE TUR BLADDER L (SET/KITS/TRAYS/PACK) ×2 IMPLANT
SURGILUBE 2OZ TUBE FLIPTOP (MISCELLANEOUS) ×2 IMPLANT
SYRINGE IRR TOOMEY STRL 70CC (SYRINGE) ×2 IMPLANT
WATER STERILE IRR 1000ML POUR (IV SOLUTION) ×2 IMPLANT

## 2019-12-07 NOTE — Discharge Instructions (Signed)
Transurethral Resection of Bladder Tumor (TURBT) or Bladder Biopsy   Definition:  Transurethral Resection of the Bladder Tumor is a surgical procedure used to diagnose and remove tumors within the bladder.   General instructions:     Your recent bladder surgery requires very little post hospital care but some definite precautions.  Despite the fact that no skin incisions were used, the area around the bladder incisions are raw and covered with scabs to promote healing and prevent bleeding. Certain precautions are needed to insure that the scabs are not disturbed over the next 2-4 weeks while the healing proceeds.  Because the raw surface inside your bladder and the irritating effects of urine you may expect frequency of urination and/or urgency (a stronger desire to urinate) and perhaps even getting up at night more often. This will usually resolve or improve slowly over the healing period. You may see some blood in your urine over the first 6 weeks. Do not be alarmed, even if the urine was clear for a while. Get off your feet and drink lots of fluids until clearing occurs. If you start to pass clots or don't improve call us.  Diet:  You may return to your normal diet immediately. Because of the raw surface of your bladder, alcohol, spicy foods, foods high in acid and drinks with caffeine may cause irritation or frequency and should be used in moderation. To keep your urine flowing freely and avoid constipation, drink plenty of fluids during the day (8-10 glasses). Tip: Avoid cranberry juice because it is very acidic.  Activity:  Your physical activity doesn't need to be restricted. However, if you are very active, you may see some blood in the urine. We suggest that you reduce your activity under the circumstances until the bleeding has stopped.  Bowels:  It is important to keep your bowels regular during the postoperative period. Straining with bowel movements can cause bleeding. A bowel  movement every other day is reasonable. Use a mild laxative if needed, such as milk of magnesia 2-3 tablespoons, or 2 Dulcolax tablets. Call if you continue to have problems. If you had been taking narcotics for pain, before, during or after your surgery, you may be constipated. Take a laxative if necessary.    Medication:  You should resume your pre-surgery medications unless told not to. In addition you may be given an antibiotic to prevent or treat infection. Antibiotics are not always necessary. All medication should be taken as prescribed until the bottles are finished unless you are having an unusual reaction to one of the drugs.   [facility] [address] [place], [place] [address] [phone]  AMBULATORY SURGERY  DISCHARGE INSTRUCTIONS   1) The drugs that you were given will stay in your system until tomorrow so for the next 24 hours you should not:  A) Drive an automobile B) Make any legal decisions C) Drink any alcoholic beverage   2) You may resume regular meals tomorrow.  Today it is better to start with liquids and gradually work up to solid foods.  You may eat anything you prefer, but it is better to start with liquids, then soup and crackers, and gradually work up to solid foods.   3) Please notify your doctor immediately if you have any unusual bleeding, trouble breathing, redness and pain at the surgery site, drainage, fever, or pain not relieved by medication.    4) Additional Instructions:        Please contact  your physician with any problems or Same Day Surgery at [phone], Monday through Friday 6 am to 4 pm, or [place] at [facility] number at [phone].

## 2019-12-07 NOTE — Transfer of Care (Signed)
Immediate Anesthesia Transfer of Care Note  Patient: Marie Bates  Procedure(s) Performed: TRANSURETHRAL RESECTION OF BLADDER TUMOR (TURBT) WITH GEMCITABINE (N/A Bladder)  Patient Location: PACU  Anesthesia Type:General  Level of Consciousness: awake, drowsy and patient cooperative  Airway & Oxygen Therapy: Patient Spontanous Breathing and Patient connected to face mask oxygen  Post-op Assessment: Report given to RN and Post -op Vital signs reviewed and stable  Post vital signs: Reviewed and stable  Last Vitals:  Vitals Value Taken Time  BP 142/83 12/07/19 0931  Temp    Pulse 72 12/07/19 0935  Resp 17 12/07/19 0935  SpO2 100 % 12/07/19 0935  Vitals shown include unvalidated device data.  Last Pain:  Vitals:   12/07/19 0723  TempSrc: Temporal  PainSc: 0-No pain         Complications: No apparent anesthesia complications

## 2019-12-07 NOTE — Anesthesia Postprocedure Evaluation (Signed)
Anesthesia Post Note  Patient: Marie Bates  Procedure(s) Performed: TRANSURETHRAL RESECTION OF BLADDER TUMOR (TURBT) WITH GEMCITABINE (N/A Bladder)  Patient location during evaluation: PACU Anesthesia Type: General Level of consciousness: awake and alert Pain management: pain level controlled Vital Signs Assessment: post-procedure vital signs reviewed and stable Respiratory status: spontaneous breathing, nonlabored ventilation and respiratory function stable Cardiovascular status: blood pressure returned to baseline and stable Postop Assessment: no apparent nausea or vomiting Anesthetic complications: no     Last Vitals:  Vitals:   12/07/19 1048 12/07/19 1106  BP: (!) 173/82 (!) 170/77  Pulse: 61 63  Resp: 16 16  Temp: (!) 36.2 C   SpO2: 98%     Last Pain:  Vitals:   12/07/19 1106  TempSrc:   PainSc: 0-No pain                 Durenda Hurt

## 2019-12-07 NOTE — Anesthesia Procedure Notes (Signed)
Procedure Name: LMA Insertion Performed by: Kelton Pillar, CRNA Pre-anesthesia Checklist: Patient identified, Emergency Drugs available, Suction available and Patient being monitored Patient Re-evaluated:Patient Re-evaluated prior to induction Oxygen Delivery Method: Circle system utilized Preoxygenation: Pre-oxygenation with 100% oxygen Induction Type: IV induction Ventilation: Mask ventilation without difficulty LMA: LMA inserted Laryngoscope Size: 4 Grade View: Grade I Number of attempts: 1 Placement Confirmation: positive ETCO2,  CO2 detector and breath sounds checked- equal and bilateral Tube secured with: Tape Dental Injury: Teeth and Oropharynx as per pre-operative assessment

## 2019-12-07 NOTE — Op Note (Signed)
Preoperative diagnosis: 1. Bladder tumor (2 cm)  Postoperative diagnosis:  1. Bladder tumor (2 cm)  Procedure:  1. Cystoscopy 2. Transurethral resection of bladder tumor (2 cm) 3. Instillation intravesical gemcitabine   Surgeon: Nicki Reaper C. Merlina Marchena, M.D.  Anesthesia: General  Complications: None  Intraoperative findings:  1. Bladder tumor: Left dome region  EBL: Minimal  Specimens: 1. Bladder tumor      Indication: Marie Bates is a patient with a history of low-grade, noninvasive urothelial carcinoma the bladder.  Initial tumor January 2020.  She received post resection gemcitabine.  Recent surveillance cystoscopy remarkable for recurrent tumor in the left dome region. After reviewing the management options for treatment, he elected to proceed with the above surgical procedure(s). We have discussed the potential benefits and risks of the procedure, side effects of the proposed treatment, the likelihood of the patient achieving the goals of the procedure, and any potential problems that might occur during the procedure or recuperation. Informed consent has been obtained.  Description of procedure:  The patient was taken to the operating room and general anesthesia was induced.  The patient was placed in the dorsal lithotomy position, prepped and draped in the usual sterile fashion, and preoperative antibiotics were administered. A preoperative time-out was performed.   A 21 French cystoscope with obturator was lubricated and passed per urethra.  Panendoscopy was performed and in the left dome region a papillary tumor was identified estimated at 2 cm.  Just lateral to the tumor was an area of erythema.  The cystoscope was removed and replaced with a 26 French continuous-flow resectoscope sheath.    An Iglesias resectoscope with loop was placed into the sheath and the tumor was then resected contiguously with the area of erythema.  It was felt the tumor had been resected superficial  muscle.  No evidence of fat or perforation was noted.  Hemostasis was obtained with cautery.  All specimen was removed with irrigation.  A 20 French Foley catheter was placed with return of clear effluent upon irrigation.  2000 mg of gemcitabine was then instilled via a closed system.  The patient appeared to tolerate the procedure well and without complications.  After anesthetic reversal she was transported to the PACU in satisfactory condition.   Plan: Gemcitabine will remain indwelling for 60 minutes then drained and the catheter will be removed.  She will be contacted with her pathology results and follow-up recommendations.   Quinci Gavidia C. Bernardo Heater,  MD

## 2019-12-07 NOTE — Anesthesia Post-op Follow-up Note (Signed)
Anesthesia QCDR form completed.        

## 2019-12-07 NOTE — Anesthesia Preprocedure Evaluation (Addendum)
Anesthesia Evaluation  Patient identified by MRN, date of birth, ID band Patient awake    Reviewed: Allergy & Precautions, H&P , NPO status , Patient's Chart, lab work & pertinent test results  Airway Mallampati: III  TM Distance: >3 FB Neck ROM: full    Dental  (+) Partial Upper, Partial Lower   Pulmonary neg pulmonary ROS,           Cardiovascular hypertension,      Neuro/Psych negative neurological ROS  negative psych ROS   GI/Hepatic Neg liver ROS, GERD  Controlled,  Endo/Other  negative endocrine ROS  Renal/GU      Musculoskeletal   Abdominal   Peds  Hematology  (+) Blood dyscrasia, anemia ,   Anesthesia Other Findings Past Medical History: No date: Anemia No date: Bladder cancer (HCC) No date: GERD (gastroesophageal reflux disease) No date: Hyperlipemia No date: Hypertension No date: Pre-diabetes No date: Psoriasis  Past Surgical History: No date: ABDOMINAL HYSTERECTOMY No date: COLONOSCOPY 12/01/2019: COLONOSCOPY WITH PROPOFOL; N/A     Comment:  Procedure: COLONOSCOPY WITH PROPOFOL;  Surgeon: [place],               Benay Pike, MD;  Location: [facility] ENDOSCOPY;  Service:               Gastroenterology;  Laterality: N/A; 01/05/2019: TRANSURETHRAL RESECTION OF BLADDER TUMOR WITH MITOMYCIN-C;  N/A     Comment:  Procedure: TRANSURETHRAL RESECTION OF BLADDER TUMOR WITH              Gemcitabine;  Surgeon: Abbie Sons, MD;  Location:               [facility] ORS;  Service: Urology;  Laterality: N/A;  BMI    Body Mass Index: 31.95 kg/m      Reproductive/Obstetrics negative OB ROS                           Anesthesia Physical Anesthesia Plan  ASA: II  Anesthesia Plan: General LMA   Post-op Pain Management:    Induction:   PONV Risk Score and Plan: Dexamethasone, Ondansetron and Treatment may vary due to age or medical condition  Airway Management Planned:   Additional  Equipment:   Intra-op Plan:   Post-operative Plan:   Informed Consent: I have reviewed the patients History and Physical, chart, labs and discussed the procedure including the risks, benefits and alternatives for the proposed anesthesia with the patient or authorized representative who has indicated his/her understanding and acceptance.     Dental Advisory Given  Plan Discussed with: Anesthesiologist  Anesthesia Plan Comments:        Anesthesia Quick Evaluation

## 2019-12-07 NOTE — Interval H&P Note (Signed)
History and Physical Interval Note:  12/07/2019 8:10 AM  Marie Bates  has presented today for surgery, with the diagnosis of Bladder Tumor.  The various methods of treatment have been discussed with the patient and family. After consideration of risks, benefits and other options for treatment, the patient has consented to  Procedure(s): TRANSURETHRAL RESECTION OF BLADDER TUMOR (TURBT) WITH GEMCITABINE (N/A) as a surgical intervention.  The patient's history has been reviewed, patient examined, no change in status, stable for surgery.  I have reviewed the patient's chart and labs.  Questions were answered to the patient's satisfaction.     Athena

## 2019-12-08 ENCOUNTER — Telehealth: Payer: Self-pay | Admitting: Urology

## 2019-12-08 DIAGNOSIS — R1900 Intra-abdominal and pelvic swelling, mass and lump, unspecified site: Secondary | ICD-10-CM

## 2019-12-08 LAB — SURGICAL PATHOLOGY

## 2019-12-08 NOTE — Telephone Encounter (Signed)
I contacted Marie Bates regarding her bladder pathology results.  The resected tumor showed inflammation and reactive changes and no evidence of urothelial carcinoma.  She has no postoperative complaints.  Her CT did show a cystic pelvic mass possibly felt related to a dermoid.  An oncology eval was recommended however she has not been seen.  Will repeat her CT.

## 2019-12-14 ENCOUNTER — Encounter: Payer: Self-pay | Admitting: *Deleted

## 2019-12-15 NOTE — Anesthesia Postprocedure Evaluation (Signed)
Anesthesia Post Note  Patient: Marie Bates  Procedure(s) Performed: COLONOSCOPY WITH PROPOFOL (N/A )  Patient location during evaluation: PACU Anesthesia Type: General Level of consciousness: awake and alert Pain management: pain level controlled Vital Signs Assessment: post-procedure vital signs reviewed and stable Respiratory status: spontaneous breathing, nonlabored ventilation, respiratory function stable and patient connected to nasal cannula oxygen Cardiovascular status: blood pressure returned to baseline and stable Postop Assessment: no apparent nausea or vomiting Anesthetic complications: no     Last Vitals:  Vitals:   12/01/19 1254 12/01/19 1255  BP: (!) 180/93 (!) 180/93  Pulse: (!) 49 61  Resp: 13   Temp:    SpO2: 100% 100%    Last Pain:  Vitals:   12/02/19 0749  TempSrc:   PainSc: 0-No pain                 Molli Barrows

## 2019-12-21 ENCOUNTER — Other Ambulatory Visit: Payer: Self-pay | Admitting: Urology

## 2020-01-28 ENCOUNTER — Telehealth: Payer: Self-pay | Admitting: Urology

## 2020-01-28 NOTE — Telephone Encounter (Signed)
error 

## 2020-02-04 ENCOUNTER — Telehealth: Payer: Self-pay | Admitting: Urology

## 2020-02-15 ENCOUNTER — Ambulatory Visit
Admission: RE | Admit: 2020-02-15 | Discharge: 2020-02-15 | Disposition: A | Discharge status: Home / Self Care | Payer: Medicare PPO | Source: Ambulatory Visit | Attending: Urology | Admitting: Urology

## 2020-02-15 ENCOUNTER — Other Ambulatory Visit: Payer: Self-pay

## 2020-02-15 DIAGNOSIS — R1900 Intra-abdominal and pelvic swelling, mass and lump, unspecified site: Secondary | ICD-10-CM | POA: Diagnosis not present

## 2020-02-15 MED ORDER — IOHEXOL 300 MG/ML  SOLN
100.0000 mL | Freq: Once | INTRAMUSCULAR | Status: AC | PRN
  Administered 2020-02-15: 100 mL via INTRAVENOUS

## 2020-02-22 ENCOUNTER — Telehealth: Payer: Self-pay | Admitting: Urology

## 2020-02-22 DIAGNOSIS — N9489 Other specified conditions associated with female genital organs and menstrual cycle: Secondary | ICD-10-CM

## 2020-02-22 NOTE — Telephone Encounter (Signed)
Call patient to discuss CT results and left message on VM to call office back at her convenience.

## 2020-03-02 NOTE — Telephone Encounter (Signed)
I was able to get in touch with Marie Bates regarding her CT scan.  She has a stable 7 x 5 x 5.2 cm well-circumscribed mass felt to be ovarian in origin.  It has not changed from her previous scan and is felt to be benign.  Interval follow-up is recommended however would recommend a gynecology evaluation.  Referral entered for  [facility] gynecology

## 2020-05-16 ENCOUNTER — Other Ambulatory Visit: Payer: Self-pay | Admitting: Obstetrics and Gynecology

## 2020-06-06 ENCOUNTER — Other Ambulatory Visit: Payer: Self-pay | Admitting: Physician Assistant

## 2020-06-06 DIAGNOSIS — Z1231 Encounter for screening mammogram for malignant neoplasm of breast: Secondary | ICD-10-CM

## 2020-06-13 ENCOUNTER — Ambulatory Visit
Admission: RE | Admit: 2020-06-13 | Discharge: 2020-06-13 | Disposition: A | Discharge status: Home / Self Care | Payer: Medicare PPO | Source: Ambulatory Visit | Attending: Physician Assistant | Admitting: Physician Assistant

## 2020-06-13 DIAGNOSIS — Z1231 Encounter for screening mammogram for malignant neoplasm of breast: Secondary | ICD-10-CM | POA: Diagnosis not present

## 2020-07-14 ENCOUNTER — Other Ambulatory Visit: Payer: Medicare PPO

## 2020-07-20 ENCOUNTER — Other Ambulatory Visit: Payer: Medicare PPO

## 2020-07-25 ENCOUNTER — Other Ambulatory Visit: Payer: Self-pay

## 2020-07-25 ENCOUNTER — Encounter
Admission: RE | Admit: 2020-07-25 | Discharge: 2020-07-25 | Disposition: A | Discharge status: Home / Self Care | Payer: Medicare PPO | Source: Ambulatory Visit | Attending: Obstetrics and Gynecology | Admitting: Obstetrics and Gynecology

## 2020-07-25 NOTE — H&P (Signed)
Marie Bates is a 74 y.o. female here forl/s BSO  .pt with a right ovarian cyst c/w a dermoid  U/s  : Right lower quadrant, well-circumscribed, ovoid mixed attenuation mass is again identified. This measures 7.0 x 5.0 by 5.2 cm (volume = 95 cm^3). On the previous exam this measured 7.0 x 6.1 by 5.4 cm (volume = 120 cm^3). This contains elements of soft tissue and fat attenuation.  Prior low vertical skin incision for TAH   Neg CA125  Past Medical History:  has a past medical history of B12 deficiency anemia, Family history of colonic polyps, History of bone density study (12/13/2005), History of bone density study (02/11/2008), History of bone density study (04/03/2010), History of bone density study (02/10/2014), Hypercholesterolemia, Hypertension, IDA (iron deficiency anemia), Osteopenia, and Psoriasis.  Past Surgical History:  has a past surgical history that includes Colonoscopy (03/15/2005); Colonoscopy (10/27/2008); egd (10/27/2008); egd (12/20/2009); egd (12/18/2012); Colonoscopy (05/13/2014); Left oophorectomy; Colonoscopy (12/01/2019); and Hysterectomy. Family History: family history includes Colon cancer (age of onset: 72) in an other family member; Colon polyps in her sister; Ovarian cancer (age of onset: 52) in her mother; Prostate cancer in her father. Social History:  reports that she has never smoked. She has never used smokeless tobacco. She reports that she does not drink alcohol and does not use drugs. OB/GYN History:          OB History    Gravida  2   Para  2   Term      Preterm      AB      Living  2     SAB      TAB      Ectopic      Molar      Multiple      Live Births  2          Allergies: has No Known Allergies. Medications:  Current Outpatient Medications:  .  amLODIPine (NORVASC) 10 MG tablet, Take 1 tablet (10 mg total) by mouth once daily, Disp: 90 tablet, Rfl: 1 .  aspirin 81 MG EC tablet, Take 81 mg by mouth once daily.,  Disp: , Rfl:  .  atorvastatin (LIPITOR) 40 MG tablet, Take 1 tablet (40 mg total) by mouth once daily, Disp: 90 tablet, Rfl: 1 .  cholecalciferol (VITAMIN D3) 2,000 unit capsule, Take 2,000 Units by mouth once daily., Disp: , Rfl:  .  clobetasoL (CORMAX) 0.05 % external solution, Apply topically to scalp twice daily as needed for psoriasis., Disp: , Rfl:  .  clobetasoL (TEMOVATE) 0.05 % ointment, Apply topically twice daily as needed to psoriasis., Disp: , Rfl:  .  clotrimazole-betamethasone (LOTRISONE) 1-0.05 % cream, Apply topically, Disp: , Rfl:  .  ferrous sulfate 325 (65 FE) MG tablet, Take 325 mg by mouth daily with breakfast., Disp: , Rfl:  .  hydroCHLOROthiazide (HYDRODIURIL) 25 MG tablet, Take 1 tablet (25 mg total) by mouth once daily, Disp: 90 tablet, Rfl: 1 .  meloxicam (MOBIC) 15 MG tablet, TAKE 1 TABLET BY MOUTH EVERY DAY, Disp: 90 tablet, Rfl: 1 .  omeprazole (PRILOSEC) 20 MG DR capsule, Take 1 capsule (20 mg total) by mouth once daily, Disp: 90 capsule, Rfl: 1 .  telmisartan (MICARDIS) 40 MG tablet, Take 1 tablet (40 mg total) by mouth once daily, Disp: 90 tablet, Rfl: 1 .  traZODone (DESYREL) 50 MG tablet, TAKE 1/2 TO 1 TABLET BY MOUTH AT BEDTIME, Disp: 30 tablet, Rfl: 2  Current  Facility-Administered Medications:  .  cyanocobalamin (VITAMIN B12) injection 1,000 mcg, 1,000 mcg, Intramuscular, Q30 Days, Paulita C[place], [place], 1,000 mcg at 02/21/20 1318  Review of Systems: General:                      No fatigue or weight loss Eyes:                           No vision changes Ears:                            No hearing difficulty Respiratory:                No cough or shortness of breath Pulmonary:                  No asthma or shortness of breath Cardiovascular:           No chest pain, palpitations, dyspnea on exertion Gastrointestinal:          No abdominal bloating, chronic diarrhea, constipations, masses, pain or hematochezia Genitourinary:             No  hematuria, dysuria, abnormal vaginal discharge, pelvic pain, Menometrorrhagia Lymphatic:                   No swollen lymph nodes Musculoskeletal:         No muscle weakness Neurologic:                  No extremity weakness, syncope, seizure disorder Psychiatric:                  No history of depression, delusions or suicidal/homicidal ideation    Exam:      Vitals:   07/24/20 1109  BP: 167/79  Pulse: 62    Body mass index is 31.64 kg/m.  WDWN / black female in NAD   Lungs: CTA  CV : RRR without murmur   Breast: exam done in sitting and lying position : No dimpling or retraction, no dominant mass, no spontaneous discharge, no axillary adenopathy Neck:  no thyromegaly Abdomen: soft , no mass, normal active bowel sounds,  non-tender, no rebound tenderness Pelvic: tanner stage 5 ,  External genitalia: vulva /labia no lesions Urethra: no prolapse Vagina: normal physiologic d/c Cervix: no lesions, no cervical motion tenderness   Uterus: normal size shape and contour, non-tender Adnexa:slight right adnexal fullness Rectovaginal:   Impression:   The encounter diagnosis was Ovarian mass, right.  C/w a dermoid cyst   Plan:   Pt has elected to undergo a Laparoscopic BSO with removal  Of presumed dermoid  Benefits and risks to surgery: The proposed benefit of the surgery has been discussed with the patient. The possible risks include, but are not limited to: organ injury to the bowel , bladder, ureters, and major blood vessels and nerves. There is a possibility of additional surgeries resulting from these injuries. There is also the risk of blood transfusion and the need to receive blood products during or after the procedure which may rarely lead to HIV or Hepatitis C infection. There is a risk of developing a deep venous thrombosis or a pulmonary embolism . There is the possibility of wound infection and also anesthetic complications, even the rare possibility of  death. The patient understands these risks and wishes to proceed. All questions have  been answered and the consent has been signed.   Return for preop.  Caroline Sauger, MD

## 2020-07-25 NOTE — Patient Instructions (Signed)
Your procedure is scheduled on: 07/31/20 Report to [place]. To find out your arrival time please call [phone] between 1PM - 3PM on 07/28/20.  Remember: Instructions that are not followed completely may result in serious medical risk, up to and including death, or upon the discretion of your surgeon and anesthesiologist your surgery may need to be rescheduled.     _X__ 1. Do not eat food after midnight the night before your procedure.                 No gum chewing or hard candies. You may drink clear liquids up to 2 hours                 before you are scheduled to arrive for your surgery- DO not drink clear                 liquids within 2 hours of the start of your surgery.                 Clear Liquids include:  water, apple juice without pulp, clear carbohydrate                 drink such as Clearfast or Gatorade, Black Coffee or Tea (Do not add                 anything to coffee or tea). Diabetics water only  __X__2.  On the morning of surgery brush your teeth with toothpaste and water, you                 may rinse your mouth with mouthwash if you wish.  Do not swallow any              toothpaste of mouthwash.     _X__ 3.  No Alcohol for 24 hours before or after surgery.   _X__ 4.  Do Not Smoke or use e-cigarettes For 24 Hours Prior to Your Surgery.                 Do not use any chewable tobacco products for at least 6 hours prior to                 surgery.  ____  5.  Bring all medications with you on the day of surgery if instructed.   __X__  6.  Notify your doctor if there is any change in your medical condition      (cold, fever, infections).     Do not wear jewelry, make-up, hairpins, clips or nail polish. Do not wear lotions, powders, or perfumes.  Do not shave 48 hours prior to surgery. Men may shave face and neck. Do not bring valuables to the hospital.    [facility] is not responsible for any belongings or  valuables.  Contacts, dentures/partials or body piercings may not be worn into surgery. Bring a case for your contacts, glasses or hearing aids, a denture cup will be supplied. Leave your suitcase in the car. After surgery it may be brought to your room. For patients admitted to the hospital, discharge time is determined by your treatment team.   Patients discharged the day of surgery will not be allowed to drive home.   Please read over the following fact sheets that you were given:   MRSA Information  __X__ Take these medicines the morning of surgery with A SIP OF WATER:  1. amLODipine (NORVASC) 10 MG tablet  2. omeprazole (PRILOSEC) 20 MG capsule  3. oxybutynin (DITROPAN) 5 MG tablet if needed  4.  5.  6.  ____ Fleet Enema (as directed)   __X__ Use CHG Soap/SAGE wipes as directed  ____ Use inhalers on the day of surgery  ____ Stop metformin/Janumet/Farxiga 2 days prior to surgery    ____ Take 1/2 of usual insulin dose the night before surgery. No insulin the morning          of surgery.   ____ Stop Blood Thinners Coumadin/Plavix/Xarelto/Pleta/Pradaxa/Eliquis/Effient/Aspirin  on   Or contact your Surgeon, Cardiologist or Medical Doctor regarding  ability to stop your blood thinners  __X__ Stop Anti-inflammatories 7 days before surgery such as Advil, Ibuprofen, Motrin,  BC or Goodies Powder, Naprosyn, Naproxen, Aleve, Aspirin  Meloxicam   __X__ Stop all herbal supplements, fish oil or vitamin E until after surgery.    ____ Bring C-Pap to the hospital.    Ensure Pre Surgery drink needs to be finished by 4:30 am or 2 hours before arriving.   How to Use an Incentive Spirometer An incentive spirometer is a tool that measures how well you are filling your lungs with each breath. Learning to take long, deep breaths using this tool can help you keep your lungs clear and active. This may help to reverse or lessen your chance of developing breathing (pulmonary) problems,  especially infection. You may be asked to use a spirometer:  After a surgery.  If you have a lung problem or a history of smoking.  After a long period of time when you have been unable to move or be active. If the spirometer includes an indicator to show the highest number that you have reached, your health care provider or respiratory therapist will help you set a goal. Keep a list (log) of your progress as told by your health care provider. What are the risks?  Breathing too quickly may cause dizziness or cause you to pass out. Take your time so you do not get dizzy or light-headed.  If you are in pain, you may need to take pain medicine before doing incentive spirometry. It is harder to take a deep breath if you are having pain. How to use your incentive spirometer  1. Sit up on the edge of your bed or on a chair. 2. Hold the incentive spirometer so that it is in an upright position. 3. Before you use the spirometer, breathe out normally. 4. Place the mouthpiece in your mouth. Make sure your lips are closed tightly around it. 5. Breathe in slowly and as deeply as you can through your mouth, causing the piston or the ball to rise toward the top of the chamber. 6. Hold your breath for 3-5 seconds, or for as long as possible. ? If the spirometer includes a coach indicator, use this to guide you in breathing. Slow down your breathing if the indicator goes above the marked areas. 7. Remove the mouthpiece from your mouth and breathe out normally. The piston or ball will return to the bottom of the chamber. 8. Rest for a few seconds, then repeat the steps 10 or more times. ? Take your time and take a few normal breaths between deep breaths so that you do not get dizzy or light-headed. ? Do this every 1-2 hours when you are awake. 9. If the spirometer includes a goal marker to show the highest number you have reached (best effort), use this  as a goal to work toward during each  repetition. 10. After each set of 10 deep breaths, cough a few times. This will help to make sure that your lungs are clear. ? If you have an incision on your chest or abdomen from surgery, place a pillow or a rolled-up towel firmly against the incision when you cough. This can help to reduce pain from coughing. General tips  When you become able to get out of bed, walk around often and continue to cough to help clear your lungs.  Keep using the incentive spirometer until your health care provider says it is okay to stop using it. If you have been in the hospital, you may be told to keep using the spirometer at home. Contact a health care provider if:  You are having difficulty using the spirometer.  You have trouble using the spirometer as often as instructed.  Your pain medicine is not giving enough relief for you to use the spirometer as told.  You have a fever.  You develop shortness of breath. Get help right away if:  You develop a cough with bloody mucus from the lungs (bloody sputum).  You have fluid or blood coming from an incision site after you cough. Summary  An incentive spirometer is a tool that can help you learn to take long, deep breaths to keep your lungs clear and active.  You may be asked to use a spirometer after a surgery, if you have a lung problem or a history of smoking, or if you have been inactive for a long period of time.  Use your incentive spirometer as instructed every 1-2 hours while you are awake.  If you have an incision on your chest or abdomen, place a pillow or a rolled-up towel firmly against your incision when you cough. This will help to reduce pain. This information is not intended to replace advice given to you by your health care provider. Make sure you discuss any questions you have with your health care provider. Document Revised: 07/09/2019 Document Reviewed: 10/22/2017 Elsevier Patient Education  2020 [employer].

## 2020-07-27 ENCOUNTER — Other Ambulatory Visit
Admission: RE | Admit: 2020-07-27 | Discharge: 2020-07-27 | Disposition: A | Discharge status: Home / Self Care | Payer: Medicare PPO | Source: Ambulatory Visit | Attending: Obstetrics and Gynecology | Admitting: Obstetrics and Gynecology

## 2020-07-27 ENCOUNTER — Other Ambulatory Visit: Payer: Self-pay

## 2020-07-27 DIAGNOSIS — Z01812 Encounter for preprocedural laboratory examination: Secondary | ICD-10-CM | POA: Diagnosis present

## 2020-07-27 DIAGNOSIS — Z20822 Contact with and (suspected) exposure to covid-19: Secondary | ICD-10-CM | POA: Insufficient documentation

## 2020-07-27 LAB — BASIC METABOLIC PANEL
Anion gap: 8 (ref 5–15)
BUN: 17 mg/dL (ref 8–23)
CO2: 28 mmol/L (ref 22–32)
Calcium: 9.4 mg/dL (ref 8.9–10.3)
Chloride: 103 mmol/L (ref 98–111)
Creatinine, Ser: 0.74 mg/dL (ref 0.44–1.00)
GFR calc Af Amer: >60 mL/min (ref >60)
GFR calc non Af Amer: >60 mL/min (ref >60)
Glucose, Bld: 99 mg/dL (ref 70–99)
Potassium: 3.7 mmol/L (ref 3.5–5.1)
Sodium: 139 mmol/L (ref 135–145)

## 2020-07-27 LAB — CBC
HCT: 34.6 % — ABNORMAL LOW (ref 36.0–46.0)
Hemoglobin: 11.4 g/dL — ABNORMAL LOW (ref 12.0–15.0)
MCH: 27.1 pg (ref 26.0–34.0)
MCHC: 32.9 g/dL (ref 30.0–36.0)
MCV: 82.2 fL (ref 80.0–100.0)
Platelets: 289 10*3/uL (ref 150–400)
RBC: 4.21 MIL/uL (ref 3.87–5.11)
RDW: 14.1 % (ref 11.5–15.5)
WBC: 5.7 10*3/uL (ref 4.0–10.5)
nRBC: 0 % (ref 0.0–0.2)

## 2020-07-27 LAB — TYPE AND SCREEN
ABO/RH(D): A POS
Antibody Screen: NEGATIVE

## 2020-07-27 LAB — SARS CORONAVIRUS 2 (TAT 6-24 HRS): SARS Coronavirus 2: NEGATIVE

## 2020-07-31 ENCOUNTER — Ambulatory Visit: Payer: Medicare PPO | Admitting: Certified Registered"

## 2020-07-31 ENCOUNTER — Encounter
Admission: RE | Disposition: A | Discharge status: Home / Self Care | Payer: Self-pay | Source: Home / Self Care | Attending: Obstetrics and Gynecology

## 2020-07-31 ENCOUNTER — Ambulatory Visit
Admission: RE | Admit: 2020-07-31 | Discharge: 2020-07-31 | Disposition: A | Discharge status: Home / Self Care | Payer: Medicare PPO | Attending: Obstetrics and Gynecology | Admitting: Obstetrics and Gynecology

## 2020-07-31 ENCOUNTER — Encounter: Payer: Self-pay | Admitting: Obstetrics and Gynecology

## 2020-07-31 DIAGNOSIS — N83291 Other ovarian cyst, right side: Secondary | ICD-10-CM | POA: Diagnosis present

## 2020-07-31 DIAGNOSIS — Z8249 Family history of ischemic heart disease and other diseases of the circulatory system: Secondary | ICD-10-CM | POA: Diagnosis not present

## 2020-07-31 DIAGNOSIS — D201 Benign neoplasm of soft tissue of peritoneum: Secondary | ICD-10-CM | POA: Insufficient documentation

## 2020-07-31 DIAGNOSIS — Z7982 Long term (current) use of aspirin: Secondary | ICD-10-CM | POA: Insufficient documentation

## 2020-07-31 DIAGNOSIS — L409 Psoriasis, unspecified: Secondary | ICD-10-CM | POA: Insufficient documentation

## 2020-07-31 DIAGNOSIS — Z791 Long term (current) use of non-steroidal anti-inflammatories (NSAID): Secondary | ICD-10-CM | POA: Insufficient documentation

## 2020-07-31 DIAGNOSIS — E7800 Pure hypercholesterolemia, unspecified: Secondary | ICD-10-CM | POA: Insufficient documentation

## 2020-07-31 DIAGNOSIS — Z79899 Other long term (current) drug therapy: Secondary | ICD-10-CM | POA: Diagnosis not present

## 2020-07-31 DIAGNOSIS — I10 Essential (primary) hypertension: Secondary | ICD-10-CM | POA: Diagnosis not present

## 2020-07-31 DIAGNOSIS — K219 Gastro-esophageal reflux disease without esophagitis: Secondary | ICD-10-CM | POA: Diagnosis not present

## 2020-07-31 DIAGNOSIS — Z90721 Acquired absence of ovaries, unilateral: Secondary | ICD-10-CM | POA: Diagnosis not present

## 2020-07-31 DIAGNOSIS — N736 Female pelvic peritoneal adhesions (postinfective): Secondary | ICD-10-CM | POA: Diagnosis not present

## 2020-07-31 DIAGNOSIS — D509 Iron deficiency anemia, unspecified: Secondary | ICD-10-CM | POA: Diagnosis not present

## 2020-07-31 DIAGNOSIS — Z8551 Personal history of malignant neoplasm of bladder: Secondary | ICD-10-CM | POA: Insufficient documentation

## 2020-07-31 DIAGNOSIS — Z90710 Acquired absence of both cervix and uterus: Secondary | ICD-10-CM | POA: Diagnosis not present

## 2020-07-31 HISTORY — PX: LAPAROSCOPIC BILATERAL SALPINGO OOPHERECTOMY: SHX5890

## 2020-07-31 LAB — ABO/RH: ABO/RH(D): A POS

## 2020-07-31 SURGERY — SALPINGO-OOPHORECTOMY, BILATERAL, LAPAROSCOPIC
Anesthesia: General | Site: Abdomen | Laterality: Right

## 2020-07-31 MED ORDER — FENTANYL CITRATE (PF) 100 MCG/2ML IJ SOLN: 25.0000 ug | INTRAMUSCULAR | Status: DC | PRN

## 2020-07-31 MED ORDER — GLYCOPYRROLATE 0.2 MG/ML IJ SOLN
INTRAMUSCULAR | Status: DC | PRN
  Administered 2020-07-31: .2 mg via INTRAVENOUS

## 2020-07-31 MED ORDER — FENTANYL CITRATE (PF) 100 MCG/2ML IJ SOLN
INTRAMUSCULAR | Status: DC | PRN
  Administered 2020-07-31 (×2): 50 ug via INTRAVENOUS

## 2020-07-31 MED ORDER — CHLORHEXIDINE GLUCONATE 0.12 % MT SOLN: 15.0000 mL | Freq: Once | OROMUCOSAL | Status: DC

## 2020-07-31 MED ORDER — LACTATED RINGERS IV SOLN: INTRAVENOUS | Status: DC

## 2020-07-31 MED ORDER — OXYCODONE HCL 5 MG PO TABS: 5.0000 mg | ORAL_TABLET | Freq: Once | ORAL | Status: DC | PRN

## 2020-07-31 MED ORDER — EPHEDRINE 5 MG/ML INJ
INTRAVENOUS | Status: AC
  Filled 2020-07-31: qty 10

## 2020-07-31 MED ORDER — OXYCODONE-ACETAMINOPHEN 5-325 MG PO TABS: 1.0000 | ORAL_TABLET | Freq: Four times a day (QID) | ORAL | Status: DC | PRN

## 2020-07-31 MED ORDER — LIDOCAINE HCL (CARDIAC) PF 100 MG/5ML IV SOSY
PREFILLED_SYRINGE | INTRAVENOUS | Status: DC | PRN
  Administered 2020-07-31: 50 mg via INTRAVENOUS

## 2020-07-31 MED ORDER — DEXAMETHASONE SODIUM PHOSPHATE 10 MG/ML IJ SOLN
INTRAMUSCULAR | Status: DC | PRN
  Administered 2020-07-31: 10 mg via INTRAVENOUS

## 2020-07-31 MED ORDER — ONDANSETRON 4 MG PO TBDP: 4.0000 mg | ORAL_TABLET | Freq: Four times a day (QID) | ORAL | Status: DC | PRN

## 2020-07-31 MED ORDER — KETOROLAC TROMETHAMINE 15 MG/ML IJ SOLN
INTRAMUSCULAR | Status: DC | PRN
  Administered 2020-07-31: 15 mg via INTRAVENOUS

## 2020-07-31 MED ORDER — EPHEDRINE SULFATE 50 MG/ML IJ SOLN
INTRAMUSCULAR | Status: DC | PRN
  Administered 2020-07-31 (×2): 10 mg via INTRAVENOUS

## 2020-07-31 MED ORDER — SUGAMMADEX SODIUM 200 MG/2ML IV SOLN
INTRAVENOUS | Status: DC | PRN
  Administered 2020-07-31: 200 mg via INTRAVENOUS

## 2020-07-31 MED ORDER — KETOROLAC TROMETHAMINE 15 MG/ML IJ SOLN
INTRAMUSCULAR | Status: AC
  Filled 2020-07-31: qty 1

## 2020-07-31 MED ORDER — OXYCODONE HCL 5 MG/5ML PO SOLN: 5.0000 mg | Freq: Once | ORAL | Status: DC | PRN

## 2020-07-31 MED ORDER — ROCURONIUM BROMIDE 100 MG/10ML IV SOLN
INTRAVENOUS | Status: DC | PRN
  Administered 2020-07-31: 35 mg via INTRAVENOUS
  Administered 2020-07-31: 20 mg via INTRAVENOUS
  Administered 2020-07-31: 5 mg via INTRAVENOUS

## 2020-07-31 MED ORDER — DEXMEDETOMIDINE HCL IN NACL 200 MCG/50ML IV SOLN
INTRAVENOUS | Status: DC | PRN
  Administered 2020-07-31 (×2): 12 ug via INTRAVENOUS

## 2020-07-31 MED ORDER — ORAL CARE MOUTH RINSE: 15.0000 mL | Freq: Once | OROMUCOSAL | Status: DC

## 2020-07-31 MED ORDER — FENTANYL CITRATE (PF) 100 MCG/2ML IJ SOLN
INTRAMUSCULAR | Status: AC
  Filled 2020-07-31: qty 2

## 2020-07-31 MED ORDER — ACETAMINOPHEN 500 MG PO TABS: 1000.0000 mg | ORAL_TABLET | ORAL | Status: DC

## 2020-07-31 MED ORDER — POVIDONE-IODINE 10 % EX SWAB: 2.0000 "application " | Freq: Once | CUTANEOUS | Status: DC

## 2020-07-31 MED ORDER — PROPOFOL 10 MG/ML IV BOLUS
INTRAVENOUS | Status: DC | PRN
  Administered 2020-07-31: 150 mg via INTRAVENOUS
  Administered 2020-07-31: 20 mg via INTRAVENOUS

## 2020-07-31 MED ORDER — GABAPENTIN 300 MG PO CAPS: 300.0000 mg | ORAL_CAPSULE | ORAL | Status: DC

## 2020-07-31 MED ORDER — BUPIVACAINE HCL 0.5 % IJ SOLN
INTRAMUSCULAR | Status: DC | PRN
  Administered 2020-07-31: 14 mL

## 2020-07-31 MED ORDER — SUCCINYLCHOLINE CHLORIDE 20 MG/ML IJ SOLN
INTRAMUSCULAR | Status: DC | PRN
  Administered 2020-07-31: 100 mg via INTRAVENOUS

## 2020-07-31 MED ORDER — BUPIVACAINE HCL (PF) 0.5 % IJ SOLN
INTRAMUSCULAR | Status: AC
  Filled 2020-07-31: qty 30

## 2020-07-31 MED ORDER — ONDANSETRON HCL 4 MG/2ML IJ SOLN
INTRAMUSCULAR | Status: DC | PRN
  Administered 2020-07-31: 4 mg via INTRAVENOUS

## 2020-07-31 SURGICAL SUPPLY — 45 items
ANCHOR TIS RET SYS 235ML (MISCELLANEOUS) ×2 IMPLANT
APL PRP STRL LF DISP 70% ISPRP (MISCELLANEOUS) ×1
BAG DRN RND TRDRP ANRFLXCHMBR (UROLOGICAL SUPPLIES)
BAG SPEC RTRVL LRG 6X4 10 (ENDOMECHANICALS) ×1
BAG TISS RTRVL C235 10X14 (MISCELLANEOUS) ×1
BAG URINE DRAIN 2000ML AR STRL (UROLOGICAL SUPPLIES) IMPLANT
BASIN GRAD PLASTIC 32OZ STRL (MISCELLANEOUS) ×2 IMPLANT
BLADE SURG SZ11 CARB STEEL (BLADE) ×2 IMPLANT
CANISTER SUCT 1200ML W/VALVE (MISCELLANEOUS) ×2 IMPLANT
CATH ROBINSON RED A/P 16FR (CATHETERS) IMPLANT
CATH SILICONE 16FRX5CC (CATHETERS) ×2 IMPLANT
CHLORAPREP W/TINT 26 (MISCELLANEOUS) ×2 IMPLANT
COVER WAND RF STERILE (DRAPES) IMPLANT
DRSG TEGADERM 2-3/8X2-3/4 SM (GAUZE/BANDAGES/DRESSINGS) ×2 IMPLANT
GAUZE 4X4 16PLY RFD (DISPOSABLE) ×4 IMPLANT
GLOVE SURG SYN 8.0 (GLOVE) ×2 IMPLANT
GOWN STRL REUS W/ TWL LRG LVL3 (GOWN DISPOSABLE) ×2 IMPLANT
GOWN STRL REUS W/ TWL XL LVL3 (GOWN DISPOSABLE) ×1 IMPLANT
GOWN STRL REUS W/TWL LRG LVL3 (GOWN DISPOSABLE) ×4
GOWN STRL REUS W/TWL XL LVL3 (GOWN DISPOSABLE) ×2
GRASPER SUT TROCAR 14GX15 (MISCELLANEOUS) IMPLANT
IRRIGATION STRYKERFLOW (MISCELLANEOUS) IMPLANT
IRRIGATOR STRYKERFLOW (MISCELLANEOUS)
IV NS 1000ML (IV SOLUTION)
IV NS 1000ML BAXH (IV SOLUTION) IMPLANT
KIT TURNOVER CYSTO (KITS) ×2 IMPLANT
KITTNER LAPARASCOPIC 5X40 (MISCELLANEOUS) ×2 IMPLANT
LABEL OR SOLS (LABEL) ×2 IMPLANT
NS IRRIG 500ML POUR BTL (IV SOLUTION) ×2 IMPLANT
PACK GYN LAPAROSCOPIC (MISCELLANEOUS) ×2 IMPLANT
PAD OB MATERNITY 4.3X12.25 (PERSONAL CARE ITEMS) ×2 IMPLANT
PAD PREP 24X41 OB/GYN DISP (PERSONAL CARE ITEMS) ×2 IMPLANT
POUCH SPECIMEN RETRIEVAL 10MM (ENDOMECHANICALS) ×2 IMPLANT
SET TUBE SMOKE EVAC HIGH FLOW (TUBING) ×2 IMPLANT
SHEARS HARMONIC ACE PLUS 36CM (ENDOMECHANICALS) ×2 IMPLANT
SLEEVE ENDOPATH XCEL 5M (ENDOMECHANICALS) ×2 IMPLANT
SPONGE GAUZE 2X2 8PLY STRL LF (GAUZE/BANDAGES/DRESSINGS) ×2 IMPLANT
STRIP CLOSURE SKIN 1/4X4 (GAUZE/BANDAGES/DRESSINGS) ×2 IMPLANT
SUT VIC AB 0 CT1 36 (SUTURE) ×2 IMPLANT
SUT VIC AB 2-0 UR6 27 (SUTURE) ×2 IMPLANT
SUT VIC AB 4-0 SH 27 (SUTURE) ×2
SUT VIC AB 4-0 SH 27XANBCTRL (SUTURE) ×1 IMPLANT
SWABSTK COMLB BENZOIN TINCTURE (MISCELLANEOUS) ×2 IMPLANT
TROCAR ENDO BLADELESS 11MM (ENDOMECHANICALS) ×2 IMPLANT
TROCAR XCEL NON-BLD 5MMX100MML (ENDOMECHANICALS) ×2 IMPLANT

## 2020-07-31 NOTE — Brief Op Note (Signed)
07/31/2020  1:36 PM  PATIENT:  Marie Bates  74 y.o. female  PRE-OPERATIVE DIAGNOSIS:  right dermoid cyst  POST-OPERATIVE DIAGNOSIS: free standing dermoid cyst attached to anterior abd wall . Pelvic and abdominal adhesions PROCEDURE:  Procedure(s): LAPAROSCOPIC RIGHT SALPINGO OOPHORECTOMY WITH PELVIC DERMOID CYST REMOVAL (Right) Lysis of adhesions  SURGEON:  Surgeon(s) and Role:    * Sharone Almond, Gwen Her, MD - Primary    * Benjaman Kindler, MD - Assisting  PHYSICIAN ASSISTANT: PA student Yehuda Savannah   ASSISTANTS: none   ANESTHESIA:   general  EBL:  5 mL   BLOOD ADMINISTERED:none  DRAINS: none   LOCAL MEDICATIONS USED:  MARCAINE     SPECIMEN:  Source of Specimen:  pelvic dermoid cyst , right tube and ovary   DISPOSITION OF SPECIMEN:  PATHOLOGY  COUNTS:  YES  TOURNIQUET:  * No tourniquets in log *  DICTATION: .Other Dictation: Dictation Number verbal  PLAN OF CARE: Admit for overnight observation  PATIENT DISPOSITION:  PACU - hemodynamically stable.   Delay start of Pharmacological VTE agent (>24hrs) due to surgical blood loss or risk of bleeding: not applicable

## 2020-07-31 NOTE — Anesthesia Preprocedure Evaluation (Signed)
Anesthesia Evaluation  Patient identified by MRN, date of birth, ID band Patient awake    Reviewed: Allergy & Precautions, H&P , NPO status , Patient's Chart, lab work & pertinent test results  History of Anesthesia Complications Negative for: history of anesthetic complications  Airway Mallampati: III  TM Distance: <3 FB Neck ROM: limited    Dental  (+) Chipped, Poor Dentition, Missing   Pulmonary neg pulmonary ROS, neg shortness of breath,    Pulmonary exam normal        Cardiovascular Exercise Tolerance: Good hypertension, (-) angina(-) Past MI and (-) DOE Normal cardiovascular exam     Neuro/Psych negative neurological ROS  negative psych ROS   GI/Hepatic Neg liver ROS, GERD  Medicated and Controlled,  Endo/Other  negative endocrine ROS  Renal/GU      Musculoskeletal   Abdominal   Peds  Hematology negative hematology ROS (+)   Anesthesia Other Findings Past Medical History: No date: Anemia No date: Bladder cancer ([facility]) No date: Cirrhosis ([place])     Comment:  pt denies No date: GERD (gastroesophageal reflux disease) No date: Hyperlipemia No date: Hypertension No date: Pre-diabetes No date: Psoriasis  Past Surgical History: No date: ABDOMINAL HYSTERECTOMY No date: COLONOSCOPY 12/01/2019: COLONOSCOPY WITH PROPOFOL; N/A     Comment:  Procedure: COLONOSCOPY WITH PROPOFOL;  Surgeon: [place],               Benay Pike, MD;  Location: [facility];  Service:               Gastroenterology;  Laterality: N/A; 12/07/2019: TRANSURETHRAL RESECTION OF BLADDER TUMOR; N/A     Comment:  Procedure: TRANSURETHRAL RESECTION OF BLADDER TUMOR               (TURBT) WITH GEMCITABINE;  Surgeon: Abbie Sons, MD;              Location: [facility] ORS;  Service: Urology;  Laterality: N/A; 01/05/2019: TRANSURETHRAL RESECTION OF BLADDER TUMOR WITH MITOMYCIN-C;  N/A     Comment:  Procedure: TRANSURETHRAL RESECTION OF BLADDER TUMOR  WITH              Gemcitabine;  Surgeon: Abbie Sons, MD;  Location:               [facility] ORS;  Service: Urology;  Laterality: N/A;  BMI    Body Mass Index: 32.58 kg/m      Reproductive/Obstetrics negative OB ROS                             Anesthesia Physical Anesthesia Plan  ASA: III  Anesthesia Plan: General ETT   Post-op Pain Management:    Induction: Intravenous  PONV Risk Score and Plan: Ondansetron, Dexamethasone, Midazolam and Treatment may vary due to age or medical condition  Airway Management Planned: Oral ETT  Additional Equipment:   Intra-op Plan:   Post-operative Plan: Extubation in OR  Informed Consent: I have reviewed the patients History and Physical, chart, labs and discussed the procedure including the risks, benefits and alternatives for the proposed anesthesia with the patient or authorized representative who has indicated his/her understanding and acceptance.     Dental Advisory Given  Plan Discussed with: Anesthesiologist, CRNA and Surgeon  Anesthesia Plan Comments: (Patient reports drinking regular Ensure at 0400 today  Patient consented for risks of anesthesia including but not limited to:  - adverse reactions to medications - damage to eyes, teeth,  lips or other oral mucosa - nerve damage due to positioning  - sore throat or hoarseness - Damage to heart, brain, nerves, lungs, other parts of body or loss of life  Patient voiced understanding.)        Anesthesia Quick Evaluation

## 2020-07-31 NOTE — Discharge Instructions (Signed)

## 2020-07-31 NOTE — Anesthesia Postprocedure Evaluation (Signed)
Anesthesia Post Note  Patient: OLIVETTE BECKMANN  Procedure(s) Performed: LAPAROSCOPIC RIGHT SALPINGO OOPHORECTOMY WITH PELVIC DERMOID CYST REMOVAL (Right Abdomen)  Patient location during evaluation: PACU Anesthesia Type: General Level of consciousness: awake and alert Pain management: pain level controlled Vital Signs Assessment: post-procedure vital signs reviewed and stable Respiratory status: spontaneous breathing, nonlabored ventilation, respiratory function stable and patient connected to nasal cannula oxygen Cardiovascular status: blood pressure returned to baseline and stable Postop Assessment: no apparent nausea or vomiting Anesthetic complications: no   No complications documented.   Last Vitals:  Vitals:   07/31/20 1407 07/31/20 1412  BP:  128/66  Pulse: (!) 57 63  Resp: 16 18  Temp:    SpO2: 100% 100%    Last Pain:  Vitals:   07/31/20 1412  TempSrc:   PainSc: Asleep                 Precious Haws Dung Prien

## 2020-07-31 NOTE — Op Note (Signed)
NAME: Marie Bates, TESORIERO MEDICAL RECORD [id number] ACCOUNT [id number] DATE OF BIRTH:1946/08/02 FACILITY: [facility] LOCATION: [facility] PHYSICIAN:Aryella Besecker Josefine Class, MD  OPERATIVE REPORT  DATE OF PROCEDURE:  07/31/2020  PREOPERATIVE DIAGNOSIS:  Complex right ovarian cyst consistent with dermoid cyst.  POSTOPERATIVE DIAGNOSES:   1.  Freestanding a pelvic cyst consistent with dermoid. 2.  Abdominopelvic adhesions.  PROCEDURES: 1.  Laparoscopic lysis of adhesions. 2.  Laparoscopic excision of pelvic cyst with removal. 3.  Right salpingo-oophorectomy.  ANESTHESIA:  General endotracheal anesthesia.  SURGEON:  Laverta Baltimore, MD.  FIRST ASSISTANT:  Leafy Ro.  SECOND ASSISTANT:  PA Psychiatrist.  INDICATIONS:  A 74 year old female noted to have a 7 x 6 cm pelvic mass thought to be the right ovary.  Contents within the cyst were consistent with a dermoid cyst.  FINDINGS:   1.  Significant abdominal and pelvic adhesions. 2.  Freestanding pelvic cyst approximately 7 x 7 cm attached to the anterior abdominal wall anterior to the normal-appearing right fallopian tube and ovary.  No evidence of left fallopian tube nor ovary.  DESCRIPTION OF PROCEDURE:  After adequate general endotracheal anesthesia, the patient was placed in dorsal supine position with the legs in the Epping stirrups.  The patient's abdomen, perineum and vagina were prepped and draped in normal sterile  fashion.  Timeout was performed.  Foley catheter placed into the bladder and a sponge stick was placed in the vagina to be used for manipulation during the procedure.  Gloves were changed.  A 5 mm infraumbilical incision was made and the laparoscope was  advanced into the abdominal cavity under direct visualization with the Optiview cannula.  The patient's abdomen was insufflated.  There was notable abdominal adhesions from prior vertical skin incision and her previous hysterectomy done in the 1980s.   Second port  placement was placed 3 cm medial to the left anterior iliac spine.  A #11 trocar was advanced under direct visualization.  Third port placement was placed in the right lower quadrant 3 cm medial to the right anterior iliac spine.  Harmonic  scalpel was brought up and multiple adhesions were taken down from the anterior abdominal wall.  These were all omental adhesions once they were freed and the scope could be placed back into the infraumbilical port site to be used for visualization.  A 7  x 7 cm right ovarian cyst was noted to be attached to the anterior abdominal wall at the level of the pelvic brim.  There did not seem to be significant communication with vasculature.  A Harmonic scalpel was used to dissect the cyst free.  Good  hemostasis was noted.  Likewise, the right infundibulopelvic ligament was cauterized and transected and the right fallopian tube and ovary were removed without difficulty.  Good hemostasis noted.  The pelvic cyst, which was consistent with a dermoid was  placed into an Endobag and this was brought through the left port site.  The cyst was rather firm and had to be morcellated through the left lower port site.  Ultimately, it was removed intact without rupturing in the bag.  Secondary EndoCatch bag was  placed and the right fallopian tube and ovary were removed.  Intraperitoneal pressure was lowered to 7 mmHg and good hemostasis was noted.  The left lower port site was closed with a deep fascial layer using the Carter-Thomason cone and PMI apparatus.   Two separate sutures were placed.  Good approximation of the fascial tissues.  The patient's abdomen was deflated and all  skin incisions were closed with interrupted 4-0 Vicryl suture.  Sponge stick was removed.  Foley catheter was removed.  There were  no complications.  The patient tolerated the procedure well.  INTRAOPERATIVE FLUIDS:  700 mL.  ESTIMATED BLOOD LOSS:  5 mL.  URINE OUTPUT:  300 mL.  DISPOSITION:  The patient  was taken to recovery room in good condition.  VN/NUANCE  D:07/31/2020 T:07/31/2020 JOB:[id number]

## 2020-07-31 NOTE — Anesthesia Procedure Notes (Signed)
Procedure Name: Intubation Performed by: Prisca Gearing, CRNA Pre-anesthesia Checklist: Patient identified, Patient being monitored, Timeout performed, Emergency Drugs available and Suction available Patient Re-evaluated:Patient Re-evaluated prior to induction Oxygen Delivery Method: Circle system utilized Preoxygenation: Pre-oxygenation with 100% oxygen Induction Type: IV induction and Rapid sequence Laryngoscope Size: 3 and McGraph Grade View: Grade I Tube type: Oral Tube size: 7.0 mm Number of attempts: 1 Airway Equipment and Method: Stylet and Video-laryngoscopy Placement Confirmation: ETT inserted through vocal cords under direct vision,  positive ETCO2 and breath sounds checked- equal and bilateral Secured at: 21 cm Tube secured with: Tape Dental Injury: Teeth and Oropharynx as per pre-operative assessment        

## 2020-07-31 NOTE — Transfer of Care (Signed)
Immediate Anesthesia Transfer of Care Note  Patient: Marie Bates  Procedure(s) Performed: LAPAROSCOPIC RIGHT SALPINGO OOPHORECTOMY WITH PELVIC DERMOID CYST REMOVAL (Right Abdomen)  Patient Location: PACU  Anesthesia Type:General  Level of Consciousness: sedated  Airway & Oxygen Therapy: Patient Spontanous Breathing and Patient connected to face mask oxygen  Post-op Assessment: Report given to RN and Post -op Vital signs reviewed and stable  Post vital signs: Reviewed  Last Vitals:  Vitals Value Taken Time  BP    Temp    Pulse 64 07/31/20 1342  Resp 22 07/31/20 1342  SpO2 99 % 07/31/20 1342  Vitals shown include unvalidated device data.  Last Pain:  Vitals:   07/31/20 0639  TempSrc: Oral  PainSc: 0-No pain         Complications: No complications documented.

## 2020-07-31 NOTE — Progress Notes (Signed)
Pt is ready for L/S BSO for dermoid cyst . Pt drank regualr ensure my mistake this am and her surgery will be delayed by 6 hrs . From consumption .  Labs reviewed . All questions answered . Proceed

## 2020-07-31 NOTE — Progress Notes (Signed)
Stated that she drank a 6 pack of regular strawberry flavored (milky) ensure that she bought at the pharmacy this morning. Finished drinking it around 4:20 am this am. Dr. Amie Critchley (anesthesia) made aware. Surgery is delayed at least 6 hours from the time of consumption of the ensure drink.

## 2020-08-01 ENCOUNTER — Encounter: Payer: Self-pay | Admitting: Obstetrics and Gynecology

## 2020-08-04 LAB — SURGICAL PATHOLOGY

## 2020-08-07 ENCOUNTER — Encounter (INDEPENDENT_AMBULATORY_CARE_PROVIDER_SITE_OTHER): Payer: Self-pay

## 2021-01-18 DIAGNOSIS — R5383 Other fatigue: Secondary | ICD-10-CM | POA: Diagnosis not present

## 2021-01-18 DIAGNOSIS — R197 Diarrhea, unspecified: Secondary | ICD-10-CM | POA: Diagnosis not present

## 2021-01-18 DIAGNOSIS — Z20822 Contact with and (suspected) exposure to covid-19: Secondary | ICD-10-CM | POA: Diagnosis not present

## 2021-01-18 DIAGNOSIS — B349 Viral infection, unspecified: Secondary | ICD-10-CM | POA: Diagnosis not present

## 2021-01-24 DIAGNOSIS — D72829 Elevated white blood cell count, unspecified: Secondary | ICD-10-CM | POA: Diagnosis not present

## 2021-01-24 DIAGNOSIS — E860 Dehydration: Secondary | ICD-10-CM | POA: Diagnosis not present

## 2021-01-30 DIAGNOSIS — D72829 Elevated white blood cell count, unspecified: Secondary | ICD-10-CM | POA: Diagnosis not present

## 2021-01-30 DIAGNOSIS — E860 Dehydration: Secondary | ICD-10-CM | POA: Diagnosis not present

## 2021-01-30 DIAGNOSIS — R197 Diarrhea, unspecified: Secondary | ICD-10-CM | POA: Diagnosis not present

## 2021-01-30 DIAGNOSIS — R5383 Other fatigue: Secondary | ICD-10-CM | POA: Diagnosis not present

## 2021-02-05 DIAGNOSIS — R5383 Other fatigue: Secondary | ICD-10-CM | POA: Diagnosis not present

## 2021-02-05 DIAGNOSIS — D72829 Elevated white blood cell count, unspecified: Secondary | ICD-10-CM | POA: Diagnosis not present

## 2021-02-05 DIAGNOSIS — E860 Dehydration: Secondary | ICD-10-CM | POA: Diagnosis not present

## 2021-02-05 DIAGNOSIS — R197 Diarrhea, unspecified: Secondary | ICD-10-CM | POA: Diagnosis not present

## 2021-03-05 DIAGNOSIS — R7303 Prediabetes: Secondary | ICD-10-CM | POA: Diagnosis not present

## 2021-03-05 DIAGNOSIS — I10 Essential (primary) hypertension: Secondary | ICD-10-CM | POA: Diagnosis not present

## 2021-03-05 DIAGNOSIS — E7800 Pure hypercholesterolemia, unspecified: Secondary | ICD-10-CM | POA: Diagnosis not present

## 2021-03-06 DIAGNOSIS — L409 Psoriasis, unspecified: Secondary | ICD-10-CM | POA: Diagnosis not present

## 2021-03-15 DIAGNOSIS — D72829 Elevated white blood cell count, unspecified: Secondary | ICD-10-CM | POA: Diagnosis not present

## 2021-03-15 DIAGNOSIS — L409 Psoriasis, unspecified: Secondary | ICD-10-CM | POA: Diagnosis not present

## 2021-03-15 DIAGNOSIS — E7800 Pure hypercholesterolemia, unspecified: Secondary | ICD-10-CM | POA: Diagnosis not present

## 2021-03-15 DIAGNOSIS — I10 Essential (primary) hypertension: Secondary | ICD-10-CM | POA: Diagnosis not present

## 2021-03-15 DIAGNOSIS — Z0000 Encounter for general adult medical examination without abnormal findings: Secondary | ICD-10-CM | POA: Diagnosis not present

## 2021-03-15 DIAGNOSIS — Z1231 Encounter for screening mammogram for malignant neoplasm of breast: Secondary | ICD-10-CM | POA: Diagnosis not present

## 2021-03-15 DIAGNOSIS — E538 Deficiency of other specified B group vitamins: Secondary | ICD-10-CM | POA: Diagnosis not present

## 2021-03-15 DIAGNOSIS — Z780 Asymptomatic menopausal state: Secondary | ICD-10-CM | POA: Diagnosis not present

## 2021-03-15 DIAGNOSIS — R7303 Prediabetes: Secondary | ICD-10-CM | POA: Diagnosis not present

## 2021-04-18 DIAGNOSIS — E538 Deficiency of other specified B group vitamins: Secondary | ICD-10-CM | POA: Diagnosis not present

## 2021-05-22 DIAGNOSIS — E538 Deficiency of other specified B group vitamins: Secondary | ICD-10-CM | POA: Diagnosis not present

## 2021-05-23 DIAGNOSIS — N838 Other noninflammatory disorders of ovary, fallopian tube and broad ligament: Secondary | ICD-10-CM | POA: Insufficient documentation

## 2021-05-23 DIAGNOSIS — Z8371 Family history of colonic polyps: Secondary | ICD-10-CM | POA: Insufficient documentation

## 2021-05-23 NOTE — Progress Notes (Signed)
Presents to [facility] for on-site pre-employment drug screen.  [facility] Acct #:  [id number] [facility] Specimen #:  [id number]  Rapid Drug Screen Results = Negative  AMD

## 2021-05-24 ENCOUNTER — Other Ambulatory Visit: Payer: Self-pay

## 2021-05-24 DIAGNOSIS — Z0283 Encounter for blood-alcohol and blood-drug test: Secondary | ICD-10-CM

## 2021-07-31 ENCOUNTER — Other Ambulatory Visit: Payer: Self-pay | Admitting: Physician Assistant

## 2021-07-31 DIAGNOSIS — Z1231 Encounter for screening mammogram for malignant neoplasm of breast: Secondary | ICD-10-CM

## 2021-08-10 ENCOUNTER — Ambulatory Visit
Admission: RE | Admit: 2021-08-10 | Discharge: 2021-08-10 | Disposition: A | Discharge status: Home / Self Care | Payer: Medicare HMO | Source: Ambulatory Visit | Attending: Physician Assistant | Admitting: Physician Assistant

## 2021-08-10 ENCOUNTER — Other Ambulatory Visit: Payer: Self-pay

## 2021-08-10 DIAGNOSIS — Z1231 Encounter for screening mammogram for malignant neoplasm of breast: Secondary | ICD-10-CM | POA: Insufficient documentation

## 2021-09-18 DIAGNOSIS — I10 Essential (primary) hypertension: Secondary | ICD-10-CM | POA: Diagnosis not present

## 2021-09-18 DIAGNOSIS — R7303 Prediabetes: Secondary | ICD-10-CM | POA: Diagnosis not present

## 2021-09-18 DIAGNOSIS — L409 Psoriasis, unspecified: Secondary | ICD-10-CM | POA: Diagnosis not present

## 2021-09-18 DIAGNOSIS — D72829 Elevated white blood cell count, unspecified: Secondary | ICD-10-CM | POA: Diagnosis not present

## 2021-09-18 DIAGNOSIS — E7800 Pure hypercholesterolemia, unspecified: Secondary | ICD-10-CM | POA: Diagnosis not present

## 2021-09-18 DIAGNOSIS — D519 Vitamin B12 deficiency anemia, unspecified: Secondary | ICD-10-CM | POA: Diagnosis not present

## 2021-10-19 DIAGNOSIS — E538 Deficiency of other specified B group vitamins: Secondary | ICD-10-CM | POA: Diagnosis not present

## 2021-12-07 IMAGING — MG MM DIGITAL SCREENING BILAT W/ TOMO AND CAD
8 series · 8 of 24 positions shown · non-contrast
Comparison: Previous exam(s).

CLINICAL DATA: Screening.

EXAM:
DIGITAL SCREENING BILATERAL MAMMOGRAM WITH TOMOSYNTHESIS AND CAD
TECHNIQUE: Bilateral screening digital craniocaudal and mediolateral oblique
mammograms were obtained. Bilateral screening digital breast
tomosynthesis was performed. The images were evaluated with
computer-aided detection.

[L CC synth-2D]
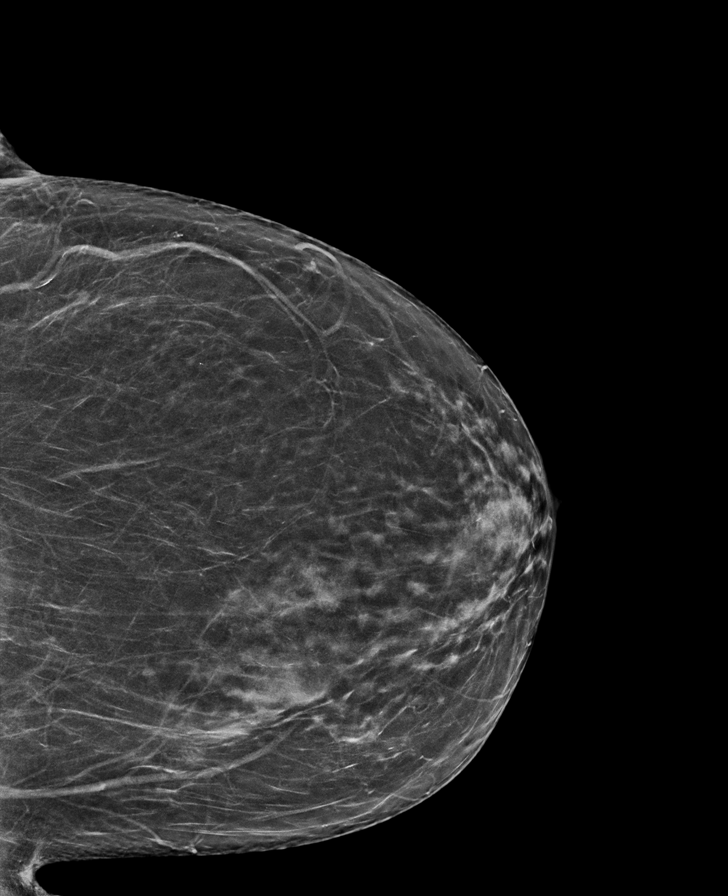

[R CC synth-2D]
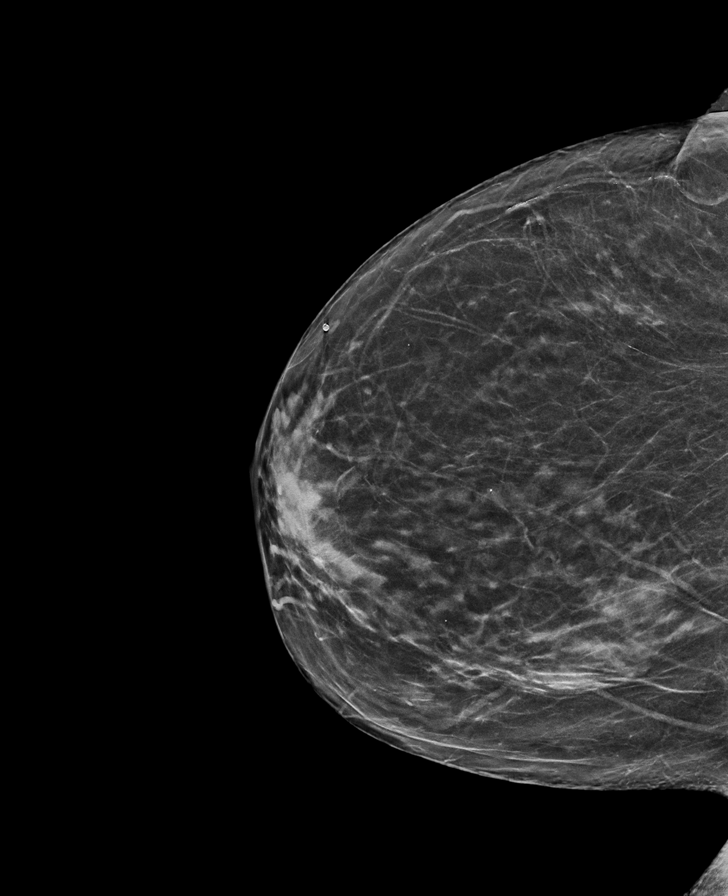

[R MLO synth-2D]
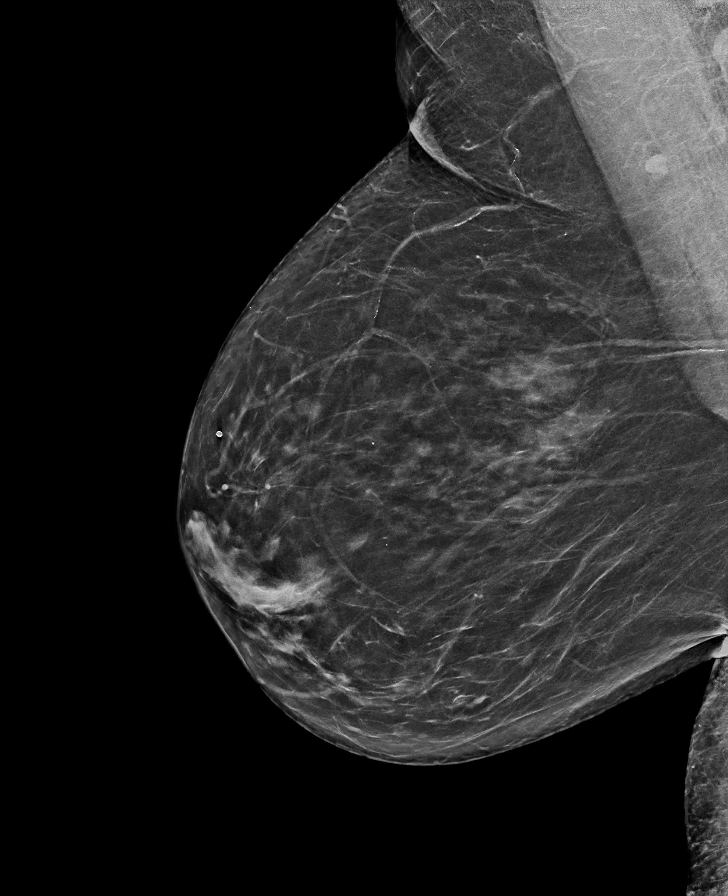

[L MLO synth-2D]
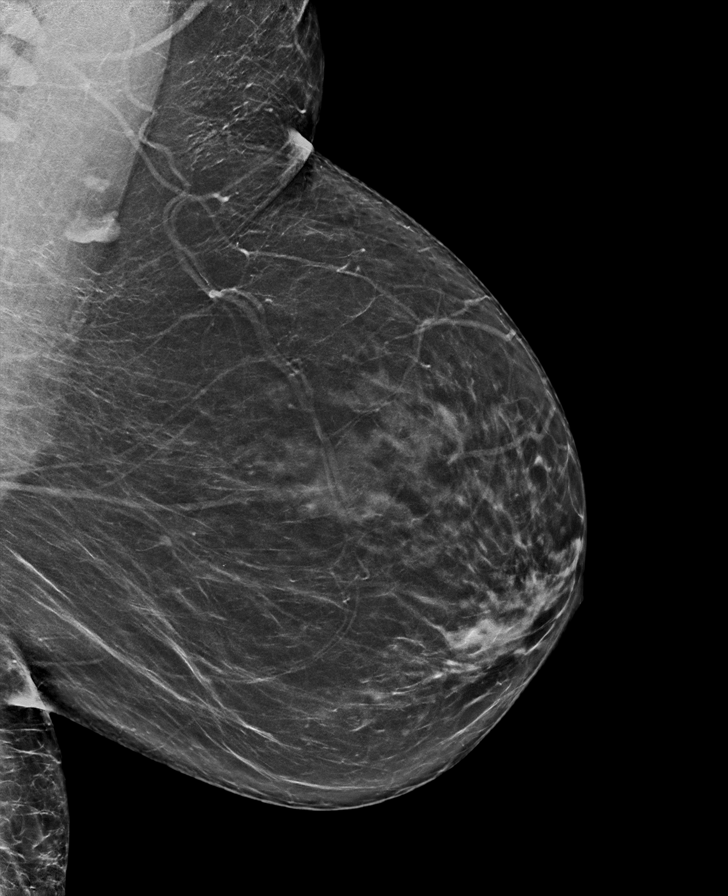

[R MLO tomo · tomo slice 33/66.0]
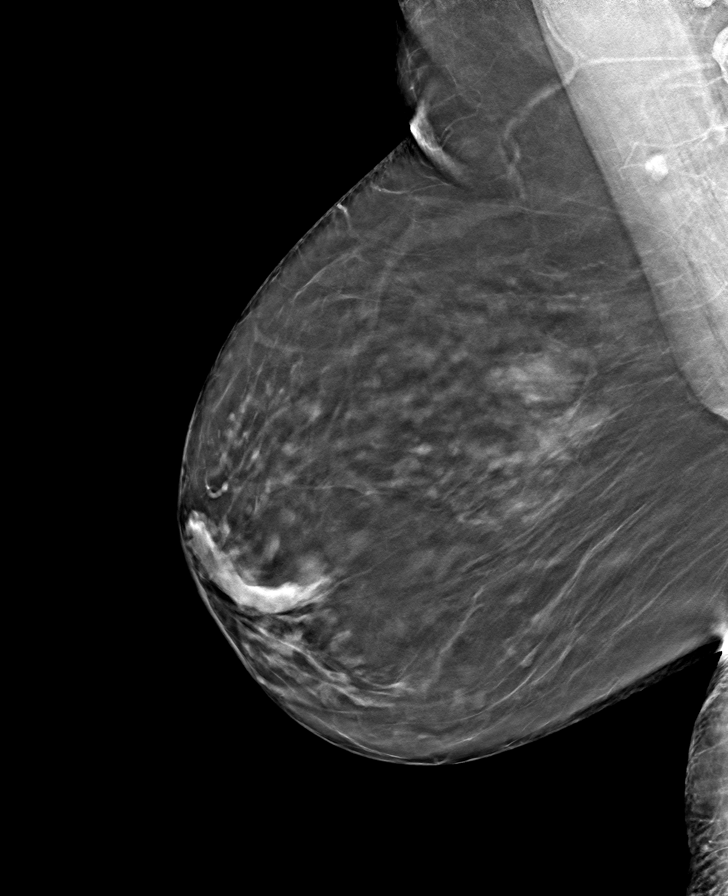

[L CC tomo · tomo slice 33/64.0]
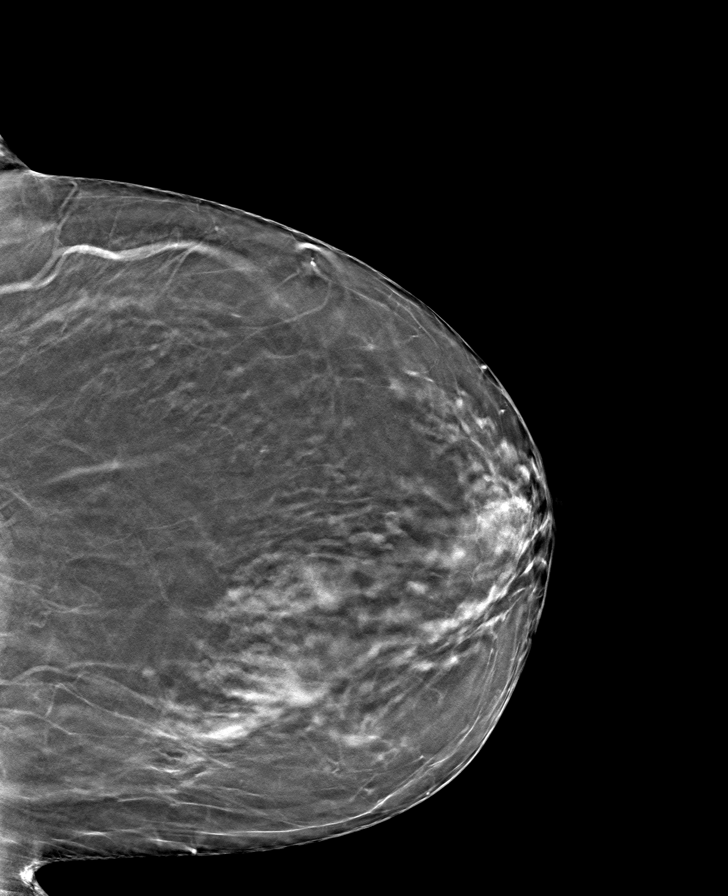

[R CC tomo · tomo slice 31/60.0]
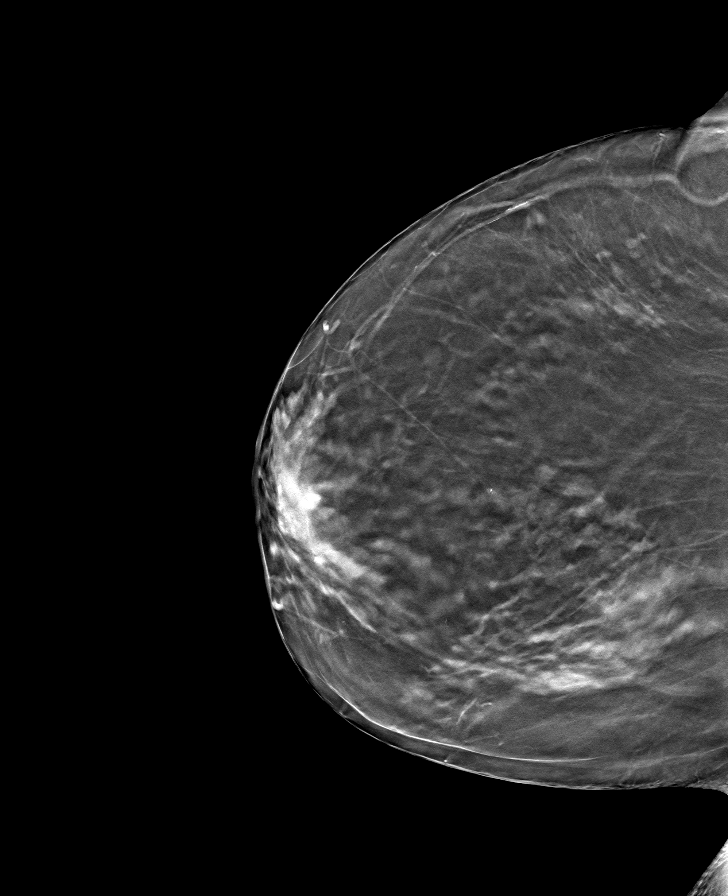

[L MLO tomo · tomo slice 37/74.0]
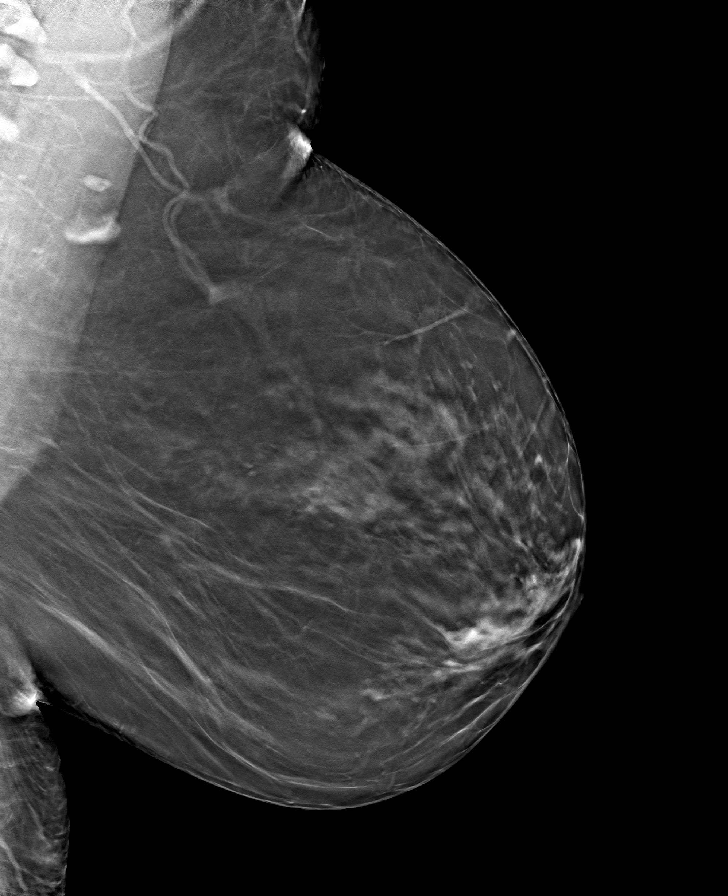

[8 of 24 positions shown; findings below may reference images not displayed]

ACR Breast Density Category b: There are scattered areas of
fibroglandular density.
FINDINGS: There are no findings suspicious for malignancy.
IMPRESSION: No mammographic evidence of malignancy. A result letter of this
screening mammogram will be mailed directly to the patient.

RECOMMENDATION:
Screening mammogram in one year. (Code:51-O-LD2)

BI-RADS CATEGORY  1: Negative.

## 2022-03-11 DIAGNOSIS — E7800 Pure hypercholesterolemia, unspecified: Secondary | ICD-10-CM | POA: Diagnosis not present

## 2022-03-11 DIAGNOSIS — I10 Essential (primary) hypertension: Secondary | ICD-10-CM | POA: Diagnosis not present

## 2022-03-11 DIAGNOSIS — R7303 Prediabetes: Secondary | ICD-10-CM | POA: Diagnosis not present

## 2022-03-12 DIAGNOSIS — L409 Psoriasis, unspecified: Secondary | ICD-10-CM | POA: Diagnosis not present

## 2022-03-19 ENCOUNTER — Other Ambulatory Visit: Payer: Self-pay | Admitting: Physician Assistant

## 2022-03-19 DIAGNOSIS — L409 Psoriasis, unspecified: Secondary | ICD-10-CM | POA: Diagnosis not present

## 2022-03-19 DIAGNOSIS — R7303 Prediabetes: Secondary | ICD-10-CM | POA: Diagnosis not present

## 2022-03-19 DIAGNOSIS — E7800 Pure hypercholesterolemia, unspecified: Secondary | ICD-10-CM | POA: Diagnosis not present

## 2022-03-19 DIAGNOSIS — E538 Deficiency of other specified B group vitamins: Secondary | ICD-10-CM | POA: Diagnosis not present

## 2022-03-19 DIAGNOSIS — Z1231 Encounter for screening mammogram for malignant neoplasm of breast: Secondary | ICD-10-CM | POA: Diagnosis not present

## 2022-03-19 DIAGNOSIS — Z0000 Encounter for general adult medical examination without abnormal findings: Secondary | ICD-10-CM | POA: Diagnosis not present

## 2022-03-19 DIAGNOSIS — I10 Essential (primary) hypertension: Secondary | ICD-10-CM | POA: Diagnosis not present

## 2022-05-14 DIAGNOSIS — L819 Disorder of pigmentation, unspecified: Secondary | ICD-10-CM | POA: Diagnosis not present

## 2022-05-14 DIAGNOSIS — L409 Psoriasis, unspecified: Secondary | ICD-10-CM | POA: Diagnosis not present

## 2022-05-15 DIAGNOSIS — I10 Essential (primary) hypertension: Secondary | ICD-10-CM | POA: Diagnosis not present

## 2022-05-28 DIAGNOSIS — E538 Deficiency of other specified B group vitamins: Secondary | ICD-10-CM | POA: Diagnosis not present

## 2022-05-28 DIAGNOSIS — I10 Essential (primary) hypertension: Secondary | ICD-10-CM | POA: Diagnosis not present

## 2022-07-02 DIAGNOSIS — E538 Deficiency of other specified B group vitamins: Secondary | ICD-10-CM | POA: Diagnosis not present

## 2022-07-02 DIAGNOSIS — I10 Essential (primary) hypertension: Secondary | ICD-10-CM | POA: Diagnosis not present

## 2022-08-09 DIAGNOSIS — E538 Deficiency of other specified B group vitamins: Secondary | ICD-10-CM | POA: Diagnosis not present

## 2022-08-12 ENCOUNTER — Ambulatory Visit
Admission: RE | Admit: 2022-08-12 | Discharge: 2022-08-12 | Disposition: A | Discharge status: Home / Self Care | Payer: Medicare HMO | Source: Ambulatory Visit | Attending: Physician Assistant | Admitting: Physician Assistant

## 2022-08-12 DIAGNOSIS — Z1231 Encounter for screening mammogram for malignant neoplasm of breast: Secondary | ICD-10-CM | POA: Diagnosis not present

## 2022-09-09 DIAGNOSIS — R7303 Prediabetes: Secondary | ICD-10-CM | POA: Diagnosis not present

## 2022-09-09 DIAGNOSIS — I10 Essential (primary) hypertension: Secondary | ICD-10-CM | POA: Diagnosis not present

## 2022-09-09 DIAGNOSIS — E7800 Pure hypercholesterolemia, unspecified: Secondary | ICD-10-CM | POA: Diagnosis not present

## 2022-09-10 DIAGNOSIS — E538 Deficiency of other specified B group vitamins: Secondary | ICD-10-CM | POA: Diagnosis not present

## 2022-09-16 DIAGNOSIS — L409 Psoriasis, unspecified: Secondary | ICD-10-CM | POA: Diagnosis not present

## 2022-09-16 DIAGNOSIS — E7800 Pure hypercholesterolemia, unspecified: Secondary | ICD-10-CM | POA: Diagnosis not present

## 2022-09-16 DIAGNOSIS — R7303 Prediabetes: Secondary | ICD-10-CM | POA: Diagnosis not present

## 2022-09-16 DIAGNOSIS — I10 Essential (primary) hypertension: Secondary | ICD-10-CM | POA: Diagnosis not present

## 2022-11-25 DIAGNOSIS — E538 Deficiency of other specified B group vitamins: Secondary | ICD-10-CM | POA: Diagnosis not present

## 2022-11-25 DIAGNOSIS — R7989 Other specified abnormal findings of blood chemistry: Secondary | ICD-10-CM | POA: Diagnosis not present

## 2022-12-26 DIAGNOSIS — R7989 Other specified abnormal findings of blood chemistry: Secondary | ICD-10-CM | POA: Diagnosis not present

## 2023-01-27 DIAGNOSIS — E538 Deficiency of other specified B group vitamins: Secondary | ICD-10-CM | POA: Diagnosis not present

## 2023-02-27 DIAGNOSIS — E538 Deficiency of other specified B group vitamins: Secondary | ICD-10-CM | POA: Diagnosis not present

## 2023-03-31 DIAGNOSIS — R7989 Other specified abnormal findings of blood chemistry: Secondary | ICD-10-CM | POA: Diagnosis not present

## 2023-03-31 DIAGNOSIS — E538 Deficiency of other specified B group vitamins: Secondary | ICD-10-CM | POA: Diagnosis not present

## 2023-04-08 ENCOUNTER — Other Ambulatory Visit: Payer: Self-pay | Admitting: Physician Assistant

## 2023-04-08 DIAGNOSIS — Z23 Encounter for immunization: Secondary | ICD-10-CM | POA: Diagnosis not present

## 2023-04-08 DIAGNOSIS — R7303 Prediabetes: Secondary | ICD-10-CM | POA: Diagnosis not present

## 2023-04-08 DIAGNOSIS — Z0000 Encounter for general adult medical examination without abnormal findings: Secondary | ICD-10-CM | POA: Diagnosis not present

## 2023-04-08 DIAGNOSIS — Z6835 Body mass index (BMI) 35.0-35.9, adult: Secondary | ICD-10-CM | POA: Diagnosis not present

## 2023-04-08 DIAGNOSIS — Z1231 Encounter for screening mammogram for malignant neoplasm of breast: Secondary | ICD-10-CM | POA: Diagnosis not present

## 2023-04-08 DIAGNOSIS — I10 Essential (primary) hypertension: Secondary | ICD-10-CM | POA: Diagnosis not present

## 2023-04-08 DIAGNOSIS — E7800 Pure hypercholesterolemia, unspecified: Secondary | ICD-10-CM | POA: Diagnosis not present

## 2023-05-07 DIAGNOSIS — Z0100 Encounter for examination of eyes and vision without abnormal findings: Secondary | ICD-10-CM | POA: Diagnosis not present

## 2023-05-07 DIAGNOSIS — H524 Presbyopia: Secondary | ICD-10-CM | POA: Diagnosis not present

## 2023-05-26 DIAGNOSIS — E538 Deficiency of other specified B group vitamins: Secondary | ICD-10-CM | POA: Diagnosis not present

## 2023-08-14 ENCOUNTER — Ambulatory Visit
Admission: RE | Admit: 2023-08-14 | Discharge: 2023-08-14 | Disposition: A | Discharge status: Home / Self Care | Payer: Medicare HMO | Source: Ambulatory Visit | Attending: Physician Assistant | Admitting: Physician Assistant

## 2023-08-14 DIAGNOSIS — Z1231 Encounter for screening mammogram for malignant neoplasm of breast: Secondary | ICD-10-CM | POA: Insufficient documentation

## 2023-10-01 DIAGNOSIS — I10 Essential (primary) hypertension: Secondary | ICD-10-CM | POA: Diagnosis not present

## 2023-10-01 DIAGNOSIS — R7303 Prediabetes: Secondary | ICD-10-CM | POA: Diagnosis not present

## 2023-10-01 DIAGNOSIS — E7800 Pure hypercholesterolemia, unspecified: Secondary | ICD-10-CM | POA: Diagnosis not present

## 2023-10-08 DIAGNOSIS — E538 Deficiency of other specified B group vitamins: Secondary | ICD-10-CM | POA: Diagnosis not present

## 2023-10-08 DIAGNOSIS — L409 Psoriasis, unspecified: Secondary | ICD-10-CM | POA: Diagnosis not present

## 2023-10-08 DIAGNOSIS — R7303 Prediabetes: Secondary | ICD-10-CM | POA: Diagnosis not present

## 2023-10-08 DIAGNOSIS — E7800 Pure hypercholesterolemia, unspecified: Secondary | ICD-10-CM | POA: Diagnosis not present

## 2023-10-08 DIAGNOSIS — I10 Essential (primary) hypertension: Secondary | ICD-10-CM | POA: Diagnosis not present

## 2023-11-04 DIAGNOSIS — Z658 Other specified problems related to psychosocial circumstances: Secondary | ICD-10-CM | POA: Diagnosis not present

## 2023-11-04 DIAGNOSIS — R413 Other amnesia: Secondary | ICD-10-CM | POA: Diagnosis not present

## 2023-11-10 DIAGNOSIS — E538 Deficiency of other specified B group vitamins: Secondary | ICD-10-CM | POA: Diagnosis not present

## 2023-11-10 DIAGNOSIS — R7989 Other specified abnormal findings of blood chemistry: Secondary | ICD-10-CM | POA: Diagnosis not present

## 2023-12-11 DIAGNOSIS — E538 Deficiency of other specified B group vitamins: Secondary | ICD-10-CM | POA: Diagnosis not present

## 2023-12-11 DIAGNOSIS — R7989 Other specified abnormal findings of blood chemistry: Secondary | ICD-10-CM | POA: Diagnosis not present

## 2024-01-13 DIAGNOSIS — Z79899 Other long term (current) drug therapy: Secondary | ICD-10-CM | POA: Diagnosis not present

## 2024-01-13 DIAGNOSIS — E538 Deficiency of other specified B group vitamins: Secondary | ICD-10-CM | POA: Diagnosis not present

## 2024-01-13 DIAGNOSIS — R7989 Other specified abnormal findings of blood chemistry: Secondary | ICD-10-CM | POA: Diagnosis not present

## 2024-01-13 DIAGNOSIS — L409 Psoriasis, unspecified: Secondary | ICD-10-CM | POA: Diagnosis not present

## 2024-01-13 DIAGNOSIS — Z1159 Encounter for screening for other viral diseases: Secondary | ICD-10-CM | POA: Diagnosis not present

## 2024-02-16 DIAGNOSIS — R7989 Other specified abnormal findings of blood chemistry: Secondary | ICD-10-CM | POA: Diagnosis not present

## 2024-02-16 DIAGNOSIS — E538 Deficiency of other specified B group vitamins: Secondary | ICD-10-CM | POA: Diagnosis not present

## 2024-03-18 DIAGNOSIS — E538 Deficiency of other specified B group vitamins: Secondary | ICD-10-CM | POA: Diagnosis not present

## 2024-03-18 DIAGNOSIS — R7989 Other specified abnormal findings of blood chemistry: Secondary | ICD-10-CM | POA: Diagnosis not present

## 2024-03-30 DIAGNOSIS — L409 Psoriasis, unspecified: Secondary | ICD-10-CM | POA: Diagnosis not present

## 2024-04-01 DIAGNOSIS — R7303 Prediabetes: Secondary | ICD-10-CM | POA: Diagnosis not present

## 2024-04-01 DIAGNOSIS — E7800 Pure hypercholesterolemia, unspecified: Secondary | ICD-10-CM | POA: Diagnosis not present

## 2024-04-01 DIAGNOSIS — I10 Essential (primary) hypertension: Secondary | ICD-10-CM | POA: Diagnosis not present

## 2024-04-08 DIAGNOSIS — L409 Psoriasis, unspecified: Secondary | ICD-10-CM | POA: Diagnosis not present

## 2024-04-08 DIAGNOSIS — Z0000 Encounter for general adult medical examination without abnormal findings: Secondary | ICD-10-CM | POA: Diagnosis not present

## 2024-04-08 DIAGNOSIS — E1122 Type 2 diabetes mellitus with diabetic chronic kidney disease: Secondary | ICD-10-CM | POA: Diagnosis not present

## 2024-04-08 DIAGNOSIS — N189 Chronic kidney disease, unspecified: Secondary | ICD-10-CM | POA: Diagnosis not present

## 2024-04-08 DIAGNOSIS — I129 Hypertensive chronic kidney disease with stage 1 through stage 4 chronic kidney disease, or unspecified chronic kidney disease: Secondary | ICD-10-CM | POA: Diagnosis not present

## 2024-04-08 DIAGNOSIS — E7800 Pure hypercholesterolemia, unspecified: Secondary | ICD-10-CM | POA: Diagnosis not present

## 2024-04-08 DIAGNOSIS — Z1231 Encounter for screening mammogram for malignant neoplasm of breast: Secondary | ICD-10-CM | POA: Diagnosis not present

## 2024-04-20 DIAGNOSIS — R7989 Other specified abnormal findings of blood chemistry: Secondary | ICD-10-CM | POA: Diagnosis not present

## 2024-04-20 DIAGNOSIS — E538 Deficiency of other specified B group vitamins: Secondary | ICD-10-CM | POA: Diagnosis not present

## 2024-05-21 DIAGNOSIS — E538 Deficiency of other specified B group vitamins: Secondary | ICD-10-CM | POA: Diagnosis not present

## 2024-05-21 DIAGNOSIS — R7989 Other specified abnormal findings of blood chemistry: Secondary | ICD-10-CM | POA: Diagnosis not present

## 2024-06-07 ENCOUNTER — Other Ambulatory Visit: Payer: Self-pay

## 2024-06-07 DIAGNOSIS — Z0283 Encounter for blood-alcohol and blood-drug test: Secondary | ICD-10-CM

## 2024-06-07 NOTE — Progress Notes (Signed)
 Random DOT UDS and BAT completed per [facility] protocol after consents signed.  Cleared BAT and UDS ready for [facility] pick up.

## 2024-07-27 DIAGNOSIS — H524 Presbyopia: Secondary | ICD-10-CM | POA: Diagnosis not present

## 2024-10-04 DIAGNOSIS — I10 Essential (primary) hypertension: Secondary | ICD-10-CM | POA: Diagnosis not present

## 2024-10-04 DIAGNOSIS — E7800 Pure hypercholesterolemia, unspecified: Secondary | ICD-10-CM | POA: Diagnosis not present

## 2024-10-04 DIAGNOSIS — R7303 Prediabetes: Secondary | ICD-10-CM | POA: Diagnosis not present

## 2024-10-05 DIAGNOSIS — L409 Psoriasis, unspecified: Secondary | ICD-10-CM | POA: Diagnosis not present

## 2024-10-11 ENCOUNTER — Other Ambulatory Visit: Payer: Self-pay | Admitting: Physician Assistant

## 2024-10-11 DIAGNOSIS — I129 Hypertensive chronic kidney disease with stage 1 through stage 4 chronic kidney disease, or unspecified chronic kidney disease: Secondary | ICD-10-CM | POA: Diagnosis not present

## 2024-10-11 DIAGNOSIS — Z1231 Encounter for screening mammogram for malignant neoplasm of breast: Secondary | ICD-10-CM | POA: Diagnosis not present

## 2024-10-11 DIAGNOSIS — N1831 Chronic kidney disease, stage 3a: Secondary | ICD-10-CM | POA: Diagnosis not present

## 2024-10-11 DIAGNOSIS — E7800 Pure hypercholesterolemia, unspecified: Secondary | ICD-10-CM | POA: Diagnosis not present

## 2024-10-11 DIAGNOSIS — R7989 Other specified abnormal findings of blood chemistry: Secondary | ICD-10-CM | POA: Diagnosis not present

## 2024-10-11 DIAGNOSIS — R7303 Prediabetes: Secondary | ICD-10-CM | POA: Diagnosis not present

## 2024-10-11 DIAGNOSIS — L409 Psoriasis, unspecified: Secondary | ICD-10-CM | POA: Diagnosis not present

## 2024-10-29 NOTE — Progress Notes (Signed)
 Marie Bates                                          MRN: [id number]   10/29/2024   The [facility] Quality Team Specialist reviewed this patient medical record for the purposes of chart review for care gap closure. The following were reviewed: chart review for care gap closure-controlling blood pressure.    [facility] Quality Team

## 2024-11-12 DIAGNOSIS — R7989 Other specified abnormal findings of blood chemistry: Secondary | ICD-10-CM | POA: Diagnosis not present

## 2024-11-12 DIAGNOSIS — D519 Vitamin B12 deficiency anemia, unspecified: Secondary | ICD-10-CM | POA: Diagnosis not present

## 2025-01-13 ENCOUNTER — Encounter: Payer: Self-pay | Admitting: Physician Assistant

## 2025-01-13 DIAGNOSIS — N1831 Chronic kidney disease, stage 3a: Secondary | ICD-10-CM

## 2025-01-13 DIAGNOSIS — I10 Essential (primary) hypertension: Secondary | ICD-10-CM

## 2025-01-14 ENCOUNTER — Other Ambulatory Visit: Payer: Self-pay | Admitting: Physician Assistant

## 2025-01-14 DIAGNOSIS — N1831 Chronic kidney disease, stage 3a: Secondary | ICD-10-CM

## 2025-01-14 DIAGNOSIS — I10 Essential (primary) hypertension: Secondary | ICD-10-CM

## 2025-01-19 ENCOUNTER — Ambulatory Visit
Admission: RE | Admit: 2025-01-19 | Discharge: 2025-01-19 | Disposition: A | Discharge status: Home / Self Care | Source: Ambulatory Visit | Attending: Physician Assistant | Admitting: Physician Assistant

## 2025-01-19 DIAGNOSIS — I10 Essential (primary) hypertension: Secondary | ICD-10-CM | POA: Diagnosis present

## 2025-01-19 DIAGNOSIS — N1831 Chronic kidney disease, stage 3a: Secondary | ICD-10-CM | POA: Diagnosis present
